# Patient Record
Sex: Female | Born: 1955 | Race: Black or African American | Hispanic: No | Marital: Married | Smoking: Never smoker
Health system: Southern US, Community
[De-identification: ages and names within clinical notes are randomized; demographics above are authoritative.]

## PROBLEM LIST (undated history)

## (undated) DIAGNOSIS — E785 Hyperlipidemia, unspecified: Secondary | ICD-10-CM

## (undated) DIAGNOSIS — D3A00 Benign carcinoid tumor of unspecified site: Secondary | ICD-10-CM

## (undated) DIAGNOSIS — I10 Essential (primary) hypertension: Secondary | ICD-10-CM

## (undated) DIAGNOSIS — D649 Anemia, unspecified: Secondary | ICD-10-CM

## (undated) DIAGNOSIS — E119 Type 2 diabetes mellitus without complications: Secondary | ICD-10-CM

## (undated) HISTORY — PX: NASAL SEPTUM SURGERY: SHX37

## (undated) HISTORY — PX: OTHER SURGICAL HISTORY: SHX169

## (undated) HISTORY — DX: Essential (primary) hypertension: I10

## (undated) HISTORY — DX: Benign carcinoid tumor of unspecified site: D3A.00

## (undated) HISTORY — DX: Hyperlipidemia, unspecified: E78.5

## (undated) HISTORY — DX: Anemia, unspecified: D64.9

---

## 1997-06-07 ENCOUNTER — Ambulatory Visit (HOSPITAL_COMMUNITY): Admission: RE | Admit: 1997-06-07 | Discharge: 1997-06-07 | Payer: Self-pay | Admitting: *Deleted

## 1997-06-21 ENCOUNTER — Ambulatory Visit (HOSPITAL_COMMUNITY): Admission: RE | Admit: 1997-06-21 | Discharge: 1997-06-21 | Payer: Self-pay | Admitting: *Deleted

## 1998-04-04 ENCOUNTER — Other Ambulatory Visit: Admission: RE | Admit: 1998-04-04 | Discharge: 1998-04-04 | Payer: Self-pay | Admitting: *Deleted

## 1998-05-20 ENCOUNTER — Other Ambulatory Visit: Admission: RE | Admit: 1998-05-20 | Discharge: 1998-05-20 | Payer: Self-pay | Admitting: *Deleted

## 1998-06-26 ENCOUNTER — Encounter: Payer: Self-pay | Admitting: *Deleted

## 1998-06-26 ENCOUNTER — Ambulatory Visit (HOSPITAL_COMMUNITY): Admission: RE | Admit: 1998-06-26 | Discharge: 1998-06-26 | Payer: Self-pay | Admitting: *Deleted

## 1998-07-11 ENCOUNTER — Encounter: Payer: Self-pay | Admitting: *Deleted

## 1998-07-11 ENCOUNTER — Ambulatory Visit (HOSPITAL_COMMUNITY): Admission: RE | Admit: 1998-07-11 | Discharge: 1998-07-11 | Payer: Self-pay | Admitting: *Deleted

## 1998-12-24 ENCOUNTER — Other Ambulatory Visit: Admission: RE | Admit: 1998-12-24 | Discharge: 1998-12-24 | Payer: Self-pay | Admitting: *Deleted

## 1999-01-06 ENCOUNTER — Ambulatory Visit (HOSPITAL_COMMUNITY): Admission: RE | Admit: 1999-01-06 | Discharge: 1999-01-06 | Payer: Self-pay | Admitting: *Deleted

## 1999-01-06 ENCOUNTER — Encounter: Payer: Self-pay | Admitting: *Deleted

## 1999-07-23 ENCOUNTER — Encounter: Payer: Self-pay | Admitting: *Deleted

## 1999-07-23 ENCOUNTER — Encounter: Admission: RE | Admit: 1999-07-23 | Discharge: 1999-07-23 | Payer: Self-pay | Admitting: *Deleted

## 2000-03-23 ENCOUNTER — Other Ambulatory Visit: Admission: RE | Admit: 2000-03-23 | Discharge: 2000-03-23 | Payer: Self-pay | Admitting: *Deleted

## 2000-11-03 ENCOUNTER — Ambulatory Visit (HOSPITAL_COMMUNITY): Admission: RE | Admit: 2000-11-03 | Discharge: 2000-11-03 | Payer: Self-pay | Admitting: *Deleted

## 2000-11-03 ENCOUNTER — Encounter: Payer: Self-pay | Admitting: *Deleted

## 2000-11-08 ENCOUNTER — Encounter: Admission: RE | Admit: 2000-11-08 | Discharge: 2000-11-08 | Payer: Self-pay | Admitting: *Deleted

## 2000-11-08 ENCOUNTER — Encounter: Payer: Self-pay | Admitting: *Deleted

## 2001-04-14 ENCOUNTER — Encounter: Payer: Self-pay | Admitting: *Deleted

## 2001-04-14 ENCOUNTER — Encounter: Admission: RE | Admit: 2001-04-14 | Discharge: 2001-04-14 | Payer: Self-pay | Admitting: *Deleted

## 2001-04-18 ENCOUNTER — Other Ambulatory Visit: Admission: RE | Admit: 2001-04-18 | Discharge: 2001-04-18 | Payer: Self-pay | Admitting: *Deleted

## 2002-07-20 ENCOUNTER — Encounter: Payer: Self-pay | Admitting: *Deleted

## 2002-07-20 ENCOUNTER — Encounter: Admission: RE | Admit: 2002-07-20 | Discharge: 2002-07-20 | Payer: Self-pay | Admitting: *Deleted

## 2002-07-31 ENCOUNTER — Encounter: Payer: Self-pay | Admitting: *Deleted

## 2002-07-31 ENCOUNTER — Encounter: Admission: RE | Admit: 2002-07-31 | Discharge: 2002-07-31 | Payer: Self-pay | Admitting: *Deleted

## 2002-08-08 ENCOUNTER — Other Ambulatory Visit: Admission: RE | Admit: 2002-08-08 | Discharge: 2002-08-08 | Payer: Self-pay | Admitting: *Deleted

## 2003-11-30 ENCOUNTER — Encounter: Admission: RE | Admit: 2003-11-30 | Discharge: 2003-11-30 | Payer: Self-pay | Admitting: *Deleted

## 2004-04-23 ENCOUNTER — Other Ambulatory Visit: Admission: RE | Admit: 2004-04-23 | Discharge: 2004-04-23 | Payer: Self-pay | Admitting: *Deleted

## 2004-05-30 ENCOUNTER — Emergency Department (HOSPITAL_COMMUNITY): Admission: EM | Admit: 2004-05-30 | Discharge: 2004-05-30 | Payer: Self-pay | Admitting: Emergency Medicine

## 2004-12-17 ENCOUNTER — Encounter: Admission: RE | Admit: 2004-12-17 | Discharge: 2004-12-17 | Payer: Self-pay | Admitting: *Deleted

## 2005-06-09 ENCOUNTER — Other Ambulatory Visit: Admission: RE | Admit: 2005-06-09 | Discharge: 2005-06-09 | Payer: Self-pay | Admitting: *Deleted

## 2006-01-12 ENCOUNTER — Encounter: Admission: RE | Admit: 2006-01-12 | Discharge: 2006-01-12 | Payer: Self-pay | Admitting: *Deleted

## 2006-08-24 ENCOUNTER — Other Ambulatory Visit: Admission: RE | Admit: 2006-08-24 | Discharge: 2006-08-24 | Payer: Self-pay | Admitting: *Deleted

## 2007-01-17 ENCOUNTER — Encounter: Admission: RE | Admit: 2007-01-17 | Discharge: 2007-01-17 | Payer: Self-pay | Admitting: *Deleted

## 2008-01-18 ENCOUNTER — Encounter: Admission: RE | Admit: 2008-01-18 | Discharge: 2008-01-18 | Payer: Self-pay | Admitting: Gynecology

## 2008-08-22 ENCOUNTER — Emergency Department (HOSPITAL_COMMUNITY): Admission: EM | Admit: 2008-08-22 | Discharge: 2008-08-22 | Payer: Self-pay | Admitting: Emergency Medicine

## 2008-11-21 ENCOUNTER — Encounter: Admission: RE | Admit: 2008-11-21 | Discharge: 2008-11-21 | Payer: Self-pay | Admitting: Family Medicine

## 2009-01-18 ENCOUNTER — Encounter: Admission: RE | Admit: 2009-01-18 | Discharge: 2009-01-18 | Payer: Self-pay | Admitting: Family Medicine

## 2010-01-20 ENCOUNTER — Encounter
Admission: RE | Admit: 2010-01-20 | Discharge: 2010-01-20 | Payer: Self-pay | Source: Home / Self Care | Attending: Family Medicine | Admitting: Family Medicine

## 2010-03-02 ENCOUNTER — Encounter: Payer: Self-pay | Admitting: *Deleted

## 2010-05-19 LAB — POCT I-STAT, CHEM 8
BUN: 18 mg/dL (ref 6–23)
Calcium, Ion: 1.21 mmol/L (ref 1.12–1.32)
Chloride: 106 mEq/L (ref 96–112)
Creatinine, Ser: 1 mg/dL (ref 0.4–1.2)
Glucose, Bld: 74 mg/dL (ref 70–99)
HCT: 42 % (ref 36.0–46.0)
Hemoglobin: 14.3 g/dL (ref 12.0–15.0)
Potassium: 3.6 mEq/L (ref 3.5–5.1)
Sodium: 140 mEq/L (ref 135–145)
TCO2: 28 mmol/L (ref 0–100)

## 2010-12-15 ENCOUNTER — Other Ambulatory Visit: Payer: Self-pay | Admitting: Gynecology

## 2010-12-15 DIAGNOSIS — Z1231 Encounter for screening mammogram for malignant neoplasm of breast: Secondary | ICD-10-CM

## 2011-01-26 ENCOUNTER — Ambulatory Visit
Admission: RE | Admit: 2011-01-26 | Discharge: 2011-01-26 | Disposition: A | Discharge status: Home / Self Care | Payer: BC Managed Care – PPO | Source: Ambulatory Visit | Attending: Gynecology | Admitting: Gynecology

## 2011-01-26 DIAGNOSIS — Z1231 Encounter for screening mammogram for malignant neoplasm of breast: Secondary | ICD-10-CM

## 2011-12-21 ENCOUNTER — Other Ambulatory Visit: Payer: Self-pay | Admitting: Gynecology

## 2011-12-21 DIAGNOSIS — Z1231 Encounter for screening mammogram for malignant neoplasm of breast: Secondary | ICD-10-CM

## 2012-01-28 ENCOUNTER — Ambulatory Visit
Admission: RE | Admit: 2012-01-28 | Discharge: 2012-01-28 | Disposition: A | Discharge status: Home / Self Care | Payer: BC Managed Care – PPO | Source: Ambulatory Visit | Attending: Gynecology | Admitting: Gynecology

## 2012-01-28 DIAGNOSIS — Z1231 Encounter for screening mammogram for malignant neoplasm of breast: Secondary | ICD-10-CM

## 2012-12-26 ENCOUNTER — Other Ambulatory Visit: Payer: Self-pay

## 2012-12-26 DIAGNOSIS — Z1231 Encounter for screening mammogram for malignant neoplasm of breast: Secondary | ICD-10-CM

## 2013-02-20 ENCOUNTER — Ambulatory Visit: Payer: BC Managed Care – PPO

## 2013-03-07 ENCOUNTER — Ambulatory Visit
Admission: RE | Admit: 2013-03-07 | Discharge: 2013-03-07 | Disposition: A | Discharge status: Home / Self Care | Payer: BC Managed Care – PPO | Source: Ambulatory Visit

## 2013-03-07 DIAGNOSIS — Z1231 Encounter for screening mammogram for malignant neoplasm of breast: Secondary | ICD-10-CM

## 2014-03-05 ENCOUNTER — Other Ambulatory Visit: Payer: Self-pay

## 2014-03-05 DIAGNOSIS — Z1231 Encounter for screening mammogram for malignant neoplasm of breast: Secondary | ICD-10-CM

## 2014-03-13 ENCOUNTER — Ambulatory Visit
Admission: RE | Admit: 2014-03-13 | Discharge: 2014-03-13 | Disposition: A | Discharge status: Home / Self Care | Payer: BC Managed Care – PPO | Source: Ambulatory Visit

## 2014-03-13 DIAGNOSIS — Z1231 Encounter for screening mammogram for malignant neoplasm of breast: Secondary | ICD-10-CM

## 2015-03-04 ENCOUNTER — Other Ambulatory Visit: Payer: Self-pay

## 2015-03-04 DIAGNOSIS — Z1231 Encounter for screening mammogram for malignant neoplasm of breast: Secondary | ICD-10-CM

## 2015-03-18 ENCOUNTER — Ambulatory Visit
Admission: RE | Admit: 2015-03-18 | Discharge: 2015-03-18 | Disposition: A | Discharge status: Home / Self Care | Payer: BC Managed Care – PPO | Source: Ambulatory Visit

## 2015-03-18 DIAGNOSIS — Z1231 Encounter for screening mammogram for malignant neoplasm of breast: Secondary | ICD-10-CM

## 2016-03-03 ENCOUNTER — Other Ambulatory Visit: Payer: Self-pay | Admitting: Family Medicine

## 2016-03-03 DIAGNOSIS — Z1231 Encounter for screening mammogram for malignant neoplasm of breast: Secondary | ICD-10-CM

## 2016-04-07 ENCOUNTER — Ambulatory Visit
Admission: RE | Admit: 2016-04-07 | Discharge: 2016-04-07 | Disposition: A | Discharge status: Home / Self Care | Payer: BC Managed Care – PPO | Source: Ambulatory Visit | Attending: Family Medicine | Admitting: Family Medicine

## 2016-04-07 DIAGNOSIS — Z1231 Encounter for screening mammogram for malignant neoplasm of breast: Secondary | ICD-10-CM

## 2017-03-02 ENCOUNTER — Other Ambulatory Visit: Payer: Self-pay | Admitting: Family Medicine

## 2017-03-02 DIAGNOSIS — Z1231 Encounter for screening mammogram for malignant neoplasm of breast: Secondary | ICD-10-CM

## 2017-04-19 ENCOUNTER — Ambulatory Visit
Admission: RE | Admit: 2017-04-19 | Discharge: 2017-04-19 | Disposition: A | Discharge status: Home / Self Care | Payer: BC Managed Care – PPO | Source: Ambulatory Visit | Attending: Family Medicine | Admitting: Family Medicine

## 2017-04-19 ENCOUNTER — Encounter: Payer: Self-pay | Admitting: Radiology

## 2017-04-19 DIAGNOSIS — Z1231 Encounter for screening mammogram for malignant neoplasm of breast: Secondary | ICD-10-CM

## 2018-03-12 ENCOUNTER — Encounter (HOSPITAL_COMMUNITY): Payer: Self-pay | Admitting: Emergency Medicine

## 2018-03-12 ENCOUNTER — Other Ambulatory Visit: Payer: Self-pay

## 2018-03-12 ENCOUNTER — Emergency Department (HOSPITAL_COMMUNITY)
Admission: EM | Admit: 2018-03-12 | Discharge: 2018-03-12 | Disposition: A | Discharge status: Other Acute Inpatient Hospital | Payer: BC Managed Care – PPO | Source: Home / Self Care | Attending: Emergency Medicine | Admitting: Emergency Medicine

## 2018-03-12 ENCOUNTER — Encounter (HOSPITAL_COMMUNITY): Payer: Self-pay

## 2018-03-12 ENCOUNTER — Inpatient Hospital Stay (HOSPITAL_COMMUNITY)
Admission: AD | Admit: 2018-03-12 | Discharge: 2018-03-16 | DRG: 885 | Disposition: A | Discharge status: Home / Self Care | Payer: BC Managed Care – PPO | Source: Intra-hospital | Attending: Psychiatry | Admitting: Psychiatry

## 2018-03-12 ENCOUNTER — Inpatient Hospital Stay (HOSPITAL_COMMUNITY): Payer: BC Managed Care – PPO

## 2018-03-12 DIAGNOSIS — E785 Hyperlipidemia, unspecified: Secondary | ICD-10-CM | POA: Diagnosis present

## 2018-03-12 DIAGNOSIS — E7800 Pure hypercholesterolemia, unspecified: Secondary | ICD-10-CM | POA: Diagnosis present

## 2018-03-12 DIAGNOSIS — F22 Delusional disorders: Secondary | ICD-10-CM

## 2018-03-12 DIAGNOSIS — F1420 Cocaine dependence, uncomplicated: Secondary | ICD-10-CM | POA: Diagnosis not present

## 2018-03-12 DIAGNOSIS — Z79899 Other long term (current) drug therapy: Secondary | ICD-10-CM

## 2018-03-12 DIAGNOSIS — F14259 Cocaine dependence with cocaine-induced psychotic disorder, unspecified: Secondary | ICD-10-CM | POA: Diagnosis present

## 2018-03-12 DIAGNOSIS — F14251 Cocaine dependence with cocaine-induced psychotic disorder with hallucinations: Secondary | ICD-10-CM | POA: Insufficient documentation

## 2018-03-12 DIAGNOSIS — Z803 Family history of malignant neoplasm of breast: Secondary | ICD-10-CM

## 2018-03-12 DIAGNOSIS — Z820 Family history of epilepsy and other diseases of the nervous system: Secondary | ICD-10-CM | POA: Diagnosis not present

## 2018-03-12 DIAGNOSIS — I10 Essential (primary) hypertension: Secondary | ICD-10-CM | POA: Diagnosis present

## 2018-03-12 DIAGNOSIS — F419 Anxiety disorder, unspecified: Secondary | ICD-10-CM | POA: Diagnosis present

## 2018-03-12 DIAGNOSIS — G35 Multiple sclerosis: Secondary | ICD-10-CM | POA: Diagnosis present

## 2018-03-12 DIAGNOSIS — Z8249 Family history of ischemic heart disease and other diseases of the circulatory system: Secondary | ICD-10-CM | POA: Diagnosis not present

## 2018-03-12 DIAGNOSIS — Z85048 Personal history of other malignant neoplasm of rectum, rectosigmoid junction, and anus: Secondary | ICD-10-CM

## 2018-03-12 DIAGNOSIS — R441 Visual hallucinations: Secondary | ICD-10-CM

## 2018-03-12 DIAGNOSIS — F323 Major depressive disorder, single episode, severe with psychotic features: Principal | ICD-10-CM | POA: Diagnosis present

## 2018-03-12 DIAGNOSIS — R443 Hallucinations, unspecified: Secondary | ICD-10-CM

## 2018-03-12 DIAGNOSIS — D509 Iron deficiency anemia, unspecified: Secondary | ICD-10-CM | POA: Diagnosis present

## 2018-03-12 LAB — COMPREHENSIVE METABOLIC PANEL
ALT: 47 U/L — ABNORMAL HIGH (ref 0–44)
AST: 46 U/L — ABNORMAL HIGH (ref 15–41)
Albumin: 4.1 g/dL (ref 3.5–5.0)
Alkaline Phosphatase: 50 U/L (ref 38–126)
Anion gap: 13 (ref 5–15)
BUN: 17 mg/dL (ref 8–23)
CO2: 28 mmol/L (ref 22–32)
Calcium: 9.9 mg/dL (ref 8.9–10.3)
Chloride: 100 mmol/L (ref 98–111)
Creatinine, Ser: 1.1 mg/dL — ABNORMAL HIGH (ref 0.44–1.00)
GFR calc Af Amer: >60 mL/min (ref >60)
GFR calc non Af Amer: 53 mL/min — ABNORMAL LOW (ref >60)
Glucose, Bld: 108 mg/dL — ABNORMAL HIGH (ref 70–99)
Potassium: 3.1 mmol/L — ABNORMAL LOW (ref 3.5–5.1)
Sodium: 141 mmol/L (ref 135–145)
TOTAL PROTEIN: 8.3 g/dL — AB (ref 6.5–8.1)
Total Bilirubin: 0.7 mg/dL (ref 0.3–1.2)

## 2018-03-12 LAB — CBC WITH DIFFERENTIAL/PLATELET
Abs Immature Granulocytes: 0.01 10*3/uL (ref 0.00–0.07)
Basophils Absolute: 0.1 10*3/uL (ref 0.0–0.1)
Basophils Relative: 1 %
EOS ABS: 0.1 10*3/uL (ref 0.0–0.5)
Eosinophils Relative: 1 %
HCT: 41.2 % (ref 36.0–46.0)
Hemoglobin: 12.2 g/dL (ref 12.0–15.0)
Immature Granulocytes: 0 %
Lymphocytes Relative: 29 %
Lymphs Abs: 2.1 10*3/uL (ref 0.7–4.0)
MCH: 22.8 pg — ABNORMAL LOW (ref 26.0–34.0)
MCHC: 29.6 g/dL — ABNORMAL LOW (ref 30.0–36.0)
MCV: 77 fL — ABNORMAL LOW (ref 80.0–100.0)
Monocytes Absolute: 0.8 10*3/uL (ref 0.1–1.0)
Monocytes Relative: 11 %
Neutro Abs: 4.2 10*3/uL (ref 1.7–7.7)
Neutrophils Relative %: 58 %
Platelets: 389 10*3/uL (ref 150–400)
RBC: 5.35 MIL/uL — ABNORMAL HIGH (ref 3.87–5.11)
RDW: 13.5 % (ref 11.5–15.5)
WBC: 7.3 10*3/uL (ref 4.0–10.5)
nRBC: 0 % (ref 0.0–0.2)

## 2018-03-12 LAB — RAPID URINE DRUG SCREEN, HOSP PERFORMED
Amphetamines: NOT DETECTED
Barbiturates: NOT DETECTED
Benzodiazepines: POSITIVE — AB
Cocaine: POSITIVE — AB
Opiates: NOT DETECTED
Tetrahydrocannabinol: NOT DETECTED

## 2018-03-12 LAB — ACETAMINOPHEN LEVEL: Acetaminophen (Tylenol), Serum: <10 ug/mL — ABNORMAL LOW (ref 10–30)

## 2018-03-12 LAB — SALICYLATE LEVEL: Salicylate Lvl: <7 mg/dL (ref 2.8–30.0)

## 2018-03-12 LAB — ETHANOL: Alcohol, Ethyl (B): <10 mg/dL (ref <10)

## 2018-03-12 MED ORDER — ATENOLOL 50 MG PO TABS
50.0000 mg | ORAL_TABLET | Freq: Every day | ORAL | Status: DC
  Administered 2018-03-12 – 2018-03-16 (×3): 50 mg via ORAL
  Filled 2018-03-12 (×8): qty 1

## 2018-03-12 MED ORDER — QUETIAPINE FUMARATE 100 MG PO TABS
100.0000 mg | ORAL_TABLET | Freq: Every day | ORAL | Status: DC
  Administered 2018-03-12: 100 mg via ORAL
  Filled 2018-03-12 (×4): qty 1

## 2018-03-12 MED ORDER — IOPAMIDOL (ISOVUE-370) INJECTION 76%
75.0000 mL | Freq: Once | INTRAVENOUS | Status: AC | PRN
  Administered 2018-03-12: 75 mL via INTRAVENOUS
  Filled 2018-03-12: qty 75

## 2018-03-12 MED ORDER — HYDROCHLOROTHIAZIDE 25 MG PO TABS
25.0000 mg | ORAL_TABLET | Freq: Every day | ORAL | Status: DC
  Administered 2018-03-12 – 2018-03-16 (×3): 25 mg via ORAL
  Filled 2018-03-12 (×8): qty 1

## 2018-03-12 MED ORDER — POTASSIUM CHLORIDE CRYS ER 20 MEQ PO TBCR: 40.0000 meq | EXTENDED_RELEASE_TABLET | Freq: Two times a day (BID) | ORAL | Status: DC

## 2018-03-12 MED ORDER — SIMVASTATIN 40 MG PO TABS
40.0000 mg | ORAL_TABLET | Freq: Every day | ORAL | Status: DC
  Administered 2018-03-12 – 2018-03-16 (×5): 40 mg via ORAL
  Filled 2018-03-12 (×5): qty 1
  Filled 2018-03-12: qty 2
  Filled 2018-03-12 (×2): qty 1

## 2018-03-12 MED ORDER — ACETAMINOPHEN 325 MG PO TABS: 650.0000 mg | ORAL_TABLET | Freq: Four times a day (QID) | ORAL | Status: DC | PRN

## 2018-03-12 MED ORDER — HYDROXYZINE HCL 25 MG PO TABS: 25.0000 mg | ORAL_TABLET | Freq: Three times a day (TID) | ORAL | Status: DC | PRN

## 2018-03-12 MED ORDER — ALUM & MAG HYDROXIDE-SIMETH 200-200-20 MG/5ML PO SUSP: 30.0000 mL | ORAL | Status: DC | PRN

## 2018-03-12 MED ORDER — INFLUENZA VAC SPLIT QUAD 0.5 ML IM SUSY
0.5000 mL | PREFILLED_SYRINGE | INTRAMUSCULAR | Status: DC
  Filled 2018-03-12: qty 0.5

## 2018-03-12 MED ORDER — FERROUS SULFATE 325 (65 FE) MG PO TABS
325.0000 mg | ORAL_TABLET | Freq: Every day | ORAL | Status: DC
  Administered 2018-03-12 – 2018-03-16 (×5): 325 mg via ORAL
  Filled 2018-03-12 (×8): qty 1

## 2018-03-12 MED ORDER — ADULT MULTIVITAMIN W/MINERALS CH
1.0000 | ORAL_TABLET | Freq: Every day | ORAL | Status: DC
  Administered 2018-03-12 – 2018-03-16 (×5): 1 via ORAL
  Filled 2018-03-12 (×8): qty 1

## 2018-03-12 MED ORDER — VITAMIN D 25 MCG (1000 UNIT) PO TABS
1000.0000 [IU] | ORAL_TABLET | Freq: Every day | ORAL | Status: DC
  Administered 2018-03-12 – 2018-03-16 (×5): 1000 [IU] via ORAL
  Filled 2018-03-12 (×8): qty 1

## 2018-03-12 MED ORDER — MAGNESIUM HYDROXIDE 400 MG/5ML PO SUSP: 30.0000 mL | Freq: Every day | ORAL | Status: DC | PRN

## 2018-03-12 NOTE — BH Assessment (Addendum)
Tele Assessment Note   Patient Name: Jody Simmons MRN: [id number] Referring Physician: Charlann Lange, PA-C Location of Patient: [facility] ED, [phone] Location of Provider: Behavioral Health TTS Department  [name] is an 63 y.o. married female who presents to [facility] ED accompanied by her two adult children, who did not participate in the assessment at Pt's request. Pt says her children are concerned because Pt's parents both had dementia and they think Pt may have mental health problems. Pt says she has had conflicts recently with her husband because he found out she was using cocaine. Pt says she believes he is putting substances in her drink. Pt says she has not seen him do this but thinks her drinks taste different. Pt says her husband is putting things in the floor that other people don't see. Pt's family reported to ED staff that Pt is hearing a beeping noise in the house and that she is seeing people hiding under the bed. Pt acknowledges that she has been feeling anxious. She says she takes Advil PM to sleep. She acknowledges her appetite has decreased. She denies current suicidal ideation or history of suicide attempts. She denies homicidal ideation or history of aggression.  Pt reports she is snorting or ingesting cocaine and her family is upset because they just found out she has been using. Pt reports she has used "on and off for the past 2-3 years this time." Pt reports she has not use cocaine in a month however Pt's urine drug screen is positive for cocaine and benzodiazepines (Pt is prescribed Klonopin). Pt reports drinking one mixed drink daily. She denies other substance use or that she doesn't take medications.  Pt identifies conflicts with her husband as her only stressor. She says she lives with her husband and he is upset with her. She says her two children are supportive. Pt says she retired in July 2019. She denies any history of abuse or trauma. She denies legal problems. Pt  denies any mental health treatment other than grief counseling in 2012 following the deaths of her parents.  Pt is dressed in hospital scrubs, alert and oriented x4. Pt speaks in a clear tone, at moderate volume and normal pace. Motor behavior appears normal. Eye contact is fair. Pt's mood is anxious and affect is congruent with mood. Thought process is coherent and relevant. Pt was cooperative throughout assessment. She says she is willing to sign voluntarily into a psychiatric facility if recommended.   Diagnosis:  F32.3 Major depressive disorder, Single episode, With psychotic features F14.20 Cocaine use disorder, Moderate  Past Medical History:  Past Medical History:  Diagnosis Date  . Anemia   . Carcinoid tumor    rectum  . Hyperlipidemia   . Hypertension     Past Surgical History:  Procedure Laterality Date  . fibroidectomy    . NASAL SEPTUM SURGERY      Family History:  Family History  Problem Relation Age of Onset  . Hypertension Mother   . Hypertension Father   . Breast cancer Maternal Grandmother     Social History:  reports that she has never smoked. She has never used smokeless tobacco. She reports current drug use. Drug: Cocaine. No history on file for alcohol.  Additional Social History:  Alcohol / Drug Use Pain Medications: Pt denies use Prescriptions: Pt denies abuse Over the Counter: Pt denies abuse History of alcohol / drug use?: Yes Longest period of sobriety (when/how long): 2 years Negative Consequences of  Use: Personal relationships Substance #1 Name of Substance 1: Cocaine (powder) 1 - Age of First Use: Pt unsure 1 - Amount (size/oz): Varies 1 - Frequency: Pt is vague and unspecific 1 - Duration: 2-3 years this episode 1 - Last Use / Amount: Unknown - Pt says one month ago but UDS is positive  CIWA: CIWA-Ar BP: (!) 150/87 Pulse Rate: 80 COWS:    Allergies: No Known Allergies  Home Medications: (Not in a hospital admission)   OB/GYN  Status:  No LMP recorded. Patient is postmenopausal.  General Assessment Data Location of Assessment: [facility] ED TTS Assessment: In system Is this a Tele or Face-to-Face Assessment?: Tele Assessment Is this an Initial Assessment or a Re-assessment for this encounter?: Initial Assessment Patient Accompanied by:: Other(Two adult children) Language Other than English: No Living Arrangements: Other (Comment)(Lives with husband) What gender do you identify as?: Female Marital status: Married Pregnancy Status: No Living Arrangements: Spouse/significant other Can pt return to current living arrangement?: Yes Admission Status: Voluntary Is patient capable of signing voluntary admission?: Yes Referral Source: Self/Family/Friend Insurance type: [place] Living Arrangements: Spouse/significant other Legal Guardian: Other:(Self) Name of Psychiatrist: None Name of Therapist: None  Education Status Is patient currently in school?: No Is the patient employed, unemployed or receiving disability?: Unemployed  Risk to self with the past 6 months Suicidal Ideation: No Has patient been a risk to self within the past 6 months prior to admission? : No Suicidal Intent: No Has patient had any suicidal intent within the past 6 months prior to admission? : No Is patient at risk for suicide?: No Suicidal Plan?: No Has patient had any suicidal plan within the past 6 months prior to admission? : No Access to Means: No What has been your use of drugs/alcohol within the last 12 months?: Pt using cocaine Previous Attempts/Gestures: No How many times?: 0 Other Self Harm Risks: None Triggers for Past Attempts: None known Intentional Self Injurious Behavior: None Family Suicide History: No Recent stressful life event(s): Conflict (Comment)(Conflict with husband) Persecutory voices/beliefs?: Yes Depression: Yes Depression Symptoms: Feeling angry/irritable, Despondent Substance abuse  history and/or treatment for substance abuse?: Yes Suicide prevention information given to non-admitted patients: Not applicable  Risk to Others within the past 6 months Homicidal Ideation: No Does patient have any lifetime risk of violence toward others beyond the six months prior to admission? : No Thoughts of Harm to Others: No Current Homicidal Intent: No Current Homicidal Plan: No Access to Homicidal Means: No Identified Victim: None History of harm to others?: No Assessment of Violence: None Noted Violent Behavior Description: Pt denies history of violence Does patient have access to weapons?: No Criminal Charges Pending?: No Does patient have a court date: No Is patient on probation?: No  Psychosis Hallucinations: Visual, Auditory Delusions: Persecutory  Mental Status Report Appearance/Hygiene: In scrubs Eye Contact: Fair Motor Activity: Unremarkable Speech: Logical/coherent Level of Consciousness: Alert Mood: Anxious Affect: Anxious Anxiety Level: Moderate Thought Processes: Coherent, Relevant Judgement: Impaired Orientation: Person, Place, Time, Situation Obsessive Compulsive Thoughts/Behaviors: None  Cognitive Functioning Concentration: Fair Memory: Recent Intact, Remote Intact Is patient IDD: No Insight: Poor Impulse Control: Fair Appetite: Poor Have you had any weight changes? : No Change Sleep: No Change Total Hours of Sleep: 8 Vegetative Symptoms: None  ADLScreening [facility] Assessment Services) Patient's cognitive ability adequate to safely complete daily activities?: Yes Patient able to express need for assistance with ADLs?: Yes Independently performs ADLs?: Yes (appropriate for developmental  age)  Prior Inpatient Therapy Prior Inpatient Therapy: No  Prior Outpatient Therapy Prior Outpatient Therapy: Yes Prior Therapy Dates: 2012 Prior Therapy Facilty/Provider(s): Unknown Reason for Treatment: Grief counseling Does patient have an ACCT team?:  No Does patient have Intensive In-House Services?  : No Does patient have [facility] services? : No Does patient have P4CC services?: No  ADL Screening (condition at time of admission) Patient's cognitive ability adequate to safely complete daily activities?: Yes Is the patient deaf or have difficulty hearing?: No Does the patient have difficulty seeing, even when wearing glasses/contacts?: No Does the patient have difficulty concentrating, remembering, or making decisions?: No Patient able to express need for assistance with ADLs?: Yes Does the patient have difficulty dressing or bathing?: No Independently performs ADLs?: Yes (appropriate for developmental age) Does the patient have difficulty walking or climbing stairs?: No Weakness of Legs: None Weakness of Arms/Hands: None  Home Assistive Devices/Equipment Home Assistive Devices/Equipment: None    Abuse/Neglect Assessment (Assessment to be complete while patient is alone) Abuse/Neglect Assessment Can Be Completed: Yes Physical Abuse: Denies Verbal Abuse: Denies Sexual Abuse: Denies Exploitation of patient/patient's resources: Denies Self-Neglect: Denies     Regulatory affairs officer (For Healthcare) Does Patient Have a Medical Advance Directive?: Yes Does patient want to make changes to medical advance directive?: No - Patient declined Type of Advance Directive: [place] in Chart?: No - copy requested          Disposition: Research officer, trade union, [place] at [facility], confirmed bed availability. Gave clinical report to Lindon Romp, FNP who said Pt meets criteria for inpatient psychiatric treatment and accepted Pt to the service of Dr. Johnn Hai, room 500-1, after Pt is medically cleared. Notified Charlann Lange, PA-C and Ezequiel Essex, RN of acceptance.  Disposition Initial Assessment Completed for this Encounter: Yes  This service was provided via telemedicine using a 2-way,  interactive audio and video technology.  Names of all persons participating in this telemedicine service and their role in this encounter. Name: Viviann Spare Role: Patient  Name: Storm Frisk, [facility] Role: TTS counselor         Orpah Greek Anson Fret, [facility], [facility], [facility] Triage Specialist [phone]  Evelena Peat 03/12/2018 3:03 AM

## 2018-03-12 NOTE — Progress Notes (Signed)
Called Radiology 3 x since 1920 with no answer

## 2018-03-12 NOTE — ED Triage Notes (Signed)
Pt arrived via [place] with daughter and son. Family reports pt has been hearing beeping in the house, seeing her husband do things that he is not. Pt's daughter reports pt has been paranoid that her husband is trying to poison her, or putting up more security in the house. Pt denies SI/HI. She reports she used cocaine about 3 weeks ago.

## 2018-03-12 NOTE — H&P (Signed)
Psychiatric Admission Assessment Adult  Patient Identification: Jody Simmons MRN:  [id number] Date of Evaluation:  03/12/2018 Chief Complaint:  MDD with psychotic features Cocaine Use Disorder Principal Diagnosis: <principal problem not specified> Diagnosis:  Active Problems:   Severe major depression with psychotic features, mood-congruent ([place])  History of Present Illness: Patient is seen and examined.  Patient is a 63 year old female with a past psychiatric history significant for cocaine dependence who presented to the [facility] emergency department on 03/12/2018 after her children brought her in for psychiatric assessment.  The patient stated that her children were concerned about her recent behavior.  The patient has a family history of dementia and her children were worried that she was developing dementia.  Patient stated that she has been using cocaine for several years.  She stated that she has been using cocaine on a semi-regular basis over the last 7 years.  She stated that she had quit using before, and that her husband found out she had used.  That it caused conflict at least according to the patient.  She stated that he had been very paranoid about her.  She stated that he had placed a tracking device on her car that she had found, and that she felt as though he was putting substances in her drinks and food because the way that they tasted.  She also admitted that there were motion detectors in the home that monitor her movement.  She has a history of hypertension, but denied any previous strokes or heart attacks.  She stated that she had not used cocaine in a month, however her drug screen was positive for cocaine and benzodiazepines (she was prescribed clonazepam).  She stated that she drank alcohol but they were only "the [place] cans".  He denied any other substances.  She is retired from working at [employer].  She denied any family history of thought disorders.  She  denied any previous psychiatric admissions or treatment.  She was admitted to the hospital for evaluation and stabilization.  Associated Signs/Symptoms: Depression Symptoms:  psychomotor agitation, fatigue, difficulty concentrating, anxiety, loss of energy/fatigue, (Hypo) Manic Symptoms:  Delusions, Hallucinations, Impulsivity, Irritable Mood, Anxiety Symptoms:  Excessive Worry, Psychotic Symptoms:  Delusions, Hallucinations: Auditory Visual Paranoia, PTSD Symptoms: Negative Total Time spent with patient: 45 minutes  Past Psychiatric History: Patient denied any previous psychiatric admissions.  She does receive clonazepam from her primary care provider.  She has abused cocaine for many years, but has not been seen by a psychiatrist, not been admitted to any facilities.  Is the patient at risk to self? No.  Has the patient been a risk to self in the past 6 months? No.  Has the patient been a risk to self within the distant past? No.  Is the patient a risk to others? Yes.    Has the patient been a risk to others in the past 6 months? Yes.    Has the patient been a risk to others within the distant past? No.   Prior Inpatient Therapy:   Prior Outpatient Therapy:    Alcohol Screening: Patient refused Alcohol Screening Tool: Yes 1. How often do you have a drink containing alcohol?: Monthly or less 2. How many drinks containing alcohol do you have on a typical day when you are drinking?: 1 or 2 3. How often do you have six or more drinks on one occasion?: Never AUDIT-C Score: 1 4. How often during the last year have you found  that you were not able to stop drinking once you had started?: Never 5. How often during the last year have you failed to do what was normally expected from you becasue of drinking?: Never 6. How often during the last year have you needed a first drink in the morning to get yourself going after a heavy drinking session?: Never 7. How often during the last year  have you had a feeling of guilt of remorse after drinking?: Never 8. How often during the last year have you been unable to remember what happened the night before because you had been drinking?: Never 9. Have you or someone else been injured as a result of your drinking?: No 10. Has a relative or friend or a doctor or another health worker been concerned about your drinking or suggested you cut down?: No Alcohol Use Disorder Identification Test Final Score (AUDIT): 1 Alcohol Brief Interventions/Follow-up: Alcohol Education Substance Abuse History in the last 12 months:  Yes.   Consequences of Substance Abuse: Medical Consequences:  Admission for this hospitalization secondary to paranoia. Family Consequences:  Marital difficulties with husband leaving her. Previous Psychotropic Medications: Yes  Psychological Evaluations: No  Past Medical History:  Past Medical History:  Diagnosis Date  . Anemia   . Carcinoid tumor    rectum  . Hyperlipidemia   . Hypertension     Past Surgical History:  Procedure Laterality Date  . fibroidectomy    . NASAL SEPTUM SURGERY     Family History:  Family History  Problem Relation Age of Onset  . Hypertension Mother   . Hypertension Father   . Breast cancer Maternal Grandmother    Family Psychiatric  History: Dementia in her parents Tobacco Screening: Have you used any form of tobacco in the last 30 days? (Cigarettes, Smokeless Tobacco, Cigars, and/or Pipes): No Social History:  Social History   Substance and Sexual Activity  Alcohol Use Not Currently     Social History   Substance and Sexual Activity  Drug Use Yes  . Types: Cocaine    Additional Social History:      History of alcohol / drug use?: Yes Negative Consequences of Use: Personal relationships Name of Substance 1: cocaine 1 - Amount (size/oz): $25 dollars worth 1 - Frequency: twice a week 1 - Duration: last 5 years 1 - Last Use / Amount: thursday 1/30                   Allergies:  No Known Allergies Lab Results:  Results for orders placed or performed during the hospital encounter of 03/12/18 (from the past 48 hour(s))  CBC with Differential     Status: Abnormal   Collection Time: 03/12/18  1:55 AM  Result Value Ref Range   WBC 7.3 4.0 - 10.5 K/uL   RBC 5.35 (H) 3.87 - 5.11 MIL/uL   Hemoglobin 12.2 12.0 - 15.0 g/dL   HCT 41.2 36.0 - 46.0 %   MCV 77.0 (L) 80.0 - 100.0 fL   MCH 22.8 (L) 26.0 - 34.0 pg   MCHC 29.6 (L) 30.0 - 36.0 g/dL   RDW 13.5 11.5 - 15.5 %   Platelets 389 150 - 400 K/uL   nRBC 0.0 0.0 - 0.2 %   Neutrophils Relative % 58 %   Neutro Abs 4.2 1.7 - 7.7 K/uL   Lymphocytes Relative 29 %   Lymphs Abs 2.1 0.7 - 4.0 K/uL   Monocytes Relative 11 %   Monocytes Absolute 0.8 0.1 -  1.0 K/uL   Eosinophils Relative 1 %   Eosinophils Absolute 0.1 0.0 - 0.5 K/uL   Basophils Relative 1 %   Basophils Absolute 0.1 0.0 - 0.1 K/uL   Immature Granulocytes 0 %   Abs Immature Granulocytes 0.01 0.00 - 0.07 K/uL    Comment: Performed at [facility] [address], [place], [place] [address]  Salicylate level     Status: None   Collection Time: 03/12/18  1:55 AM  Result Value Ref Range   Salicylate Lvl <0.0 2.8 - 30.0 mg/dL    Comment: Performed at [facility] [address], [place], [place] [address]  Comprehensive metabolic panel     Status: Abnormal   Collection Time: 03/12/18  1:55 AM  Result Value Ref Range   Sodium 141 135 - 145 mmol/L   Potassium 3.1 (L) 3.5 - 5.1 mmol/L   Chloride 100 98 - 111 mmol/L   CO2 28 22 - 32 mmol/L   Glucose, Bld 108 (H) 70 - 99 mg/dL   BUN 17 8 - 23 mg/dL   Creatinine, Ser 1.10 (H) 0.44 - 1.00 mg/dL   Calcium 9.9 8.9 - 10.3 mg/dL   Total Protein 8.3 (H) 6.5 - 8.1 g/dL   Albumin 4.1 3.5 - 5.0 g/dL   AST 46 (H) 15 - 41 U/L   ALT 47 (H) 0 - 44 U/L   Alkaline Phosphatase 50 38 - 126 U/L   Total Bilirubin 0.7 0.3 - 1.2 mg/dL   GFR calc non Af Amer 53 (L) >60 mL/min   GFR calc Af Amer >60  >60 mL/min   Anion gap 13 5 - 15    Comment: Performed at [facility], [place] [address], [place], [place] [address]  Acetaminophen level     Status: Abnormal   Collection Time: 03/12/18  1:55 AM  Result Value Ref Range   Acetaminophen (Tylenol), Serum <10 (L) 10 - 30 ug/mL    Comment: (NOTE) Therapeutic concentrations vary significantly. A range of 10-30 ug/mL  may be an effective concentration for many patients. However, some  are best treated at concentrations outside of this range. Acetaminophen concentrations >150 ug/mL at 4 hours after ingestion  and >50 ug/mL at 12 hours after ingestion are often associated with  toxic reactions. Performed at [facility], [place] [address], [place], [place] [address]   Ethanol     Status: None   Collection Time: 03/12/18  1:55 AM  Result Value Ref Range   Alcohol, Ethyl (B) <10 <10 mg/dL    Comment: (NOTE) Lowest detectable limit for serum alcohol is 10 mg/dL. For medical purposes only. Performed at [facility], [place] [address], [place],  [address]   Urine rapid drug screen (hosp performed)     Status: Abnormal   Collection Time: 03/12/18  1:56 AM  Result Value Ref Range   Opiates NONE DETECTED NONE DETECTED   Cocaine POSITIVE (A) NONE DETECTED   Benzodiazepines POSITIVE (A) NONE DETECTED   Amphetamines NONE DETECTED NONE DETECTED   Tetrahydrocannabinol NONE DETECTED NONE DETECTED   Barbiturates NONE DETECTED NONE DETECTED    Comment: (NOTE) DRUG SCREEN FOR MEDICAL PURPOSES ONLY.  IF CONFIRMATION IS NEEDED FOR ANY PURPOSE, NOTIFY LAB WITHIN 5 DAYS. LOWEST DETECTABLE LIMITS FOR URINE DRUG SCREEN Drug Class                     Cutoff (ng/mL) Amphetamine and metabolites  1000 Barbiturate and metabolites    200 Benzodiazepine                 161 Tricyclics and metabolites     300 Opiates and metabolites        300 Cocaine and metabolites        300 THC                            50 Performed at [facility], [place] [address] [place], [place] [address]     Blood Alcohol level:  Lab Results  Component Value Date   ETH <10 54/10/8117    Metabolic Disorder Labs:  No results found for: HGBA1C, MPG No results found for: PROLACTIN No results found for: CHOL, TRIG, HDL, CHOLHDL, VLDL, LDLCALC  Current Medications: Current Facility-Administered Medications  Medication Dose Route Frequency Provider Last Rate Last Dose  . acetaminophen (TYLENOL) tablet 650 mg  650 mg Oral Q6H PRN Lindon Romp A, NP      . alum & mag hydroxide-simeth (MAALOX/MYLANTA) 200-200-20 MG/5ML suspension 30 mL  30 mL Oral Q4H PRN Lindon Romp A, NP      . atenolol (TENORMIN) tablet 50 mg  50 mg Oral Daily Lindon Romp A, NP   50 mg at 03/12/18 0816  . cholecalciferol (VITAMIN D3) tablet 1,000 Units  1,000 Units Oral Daily Lindon Romp A, NP   1,000 Units at 03/12/18 0817  . ferrous sulfate tablet 325 mg  325 mg Oral Q breakfast Lindon Romp A, NP   325 mg at 03/12/18 1478  . hydrochlorothiazide (HYDRODIURIL) tablet 25 mg  25 mg Oral Daily Lindon Romp A, NP   25 mg at 03/12/18 2956  . hydrOXYzine (ATARAX/VISTARIL) tablet 25 mg  25 mg Oral TID PRN Rozetta Nunnery, NP      . Derrill Memo ON 03/13/2018] Influenza vac split quadrivalent PF (FLUARIX) injection 0.5 mL  0.5 mL Intramuscular Tomorrow-1000 Lindon Romp A, NP      . magnesium hydroxide (MILK OF MAGNESIA) suspension 30 mL  30 mL Oral Daily PRN Lindon Romp A, NP      . multivitamin with minerals tablet 1 tablet  1 tablet Oral Daily Lindon Romp A, NP   1 tablet at 03/12/18 0816  . simvastatin (ZOCOR) tablet 40 mg  40 mg Oral Daily Lindon Romp A, NP   40 mg at 03/12/18 2130   PTA Medications: Medications Prior to Admission  Medication Sig Dispense Refill Last Dose  . atenolol (TENORMIN) 50 MG tablet Take 50 mg by mouth daily.   03/11/2018 at Unknown time  . cholecalciferol (VITAMIN D) 1000 UNITS tablet Take 1,000 Units by mouth daily.   03/11/2018 at Unknown time  .  clonazePAM (KLONOPIN) 1 MG tablet Take 1 mg by mouth 2 (two) times daily as needed for anxiety.    Past Month at Unknown time  . ferrous sulfate 325 (65 FE) MG tablet Take 325 mg by mouth daily with breakfast.   03/11/2018 at Unknown time  . hydrochlorothiazide (HYDRODIURIL) 25 MG tablet Take 25 mg by mouth daily. 1/2 tablet daily   03/11/2018 at Unknown time  . Multiple Vitamin (MULTI VITAMIN DAILY PO) Take by mouth.   03/11/2018 at Unknown time  . simvastatin (ZOCOR) 40 MG tablet Take 40 mg by mouth daily.   03/11/2018 at Unknown time    Musculoskeletal: Strength & Muscle Tone: within normal limits Gait & Station:  normal Patient leans: N/A  Psychiatric Specialty Exam: Physical Exam  Nursing note and vitals reviewed. Constitutional: She is oriented to person, place, and time. She appears well-developed and well-nourished.  HENT:  Head: Normocephalic and atraumatic.  Respiratory: Effort normal.  Neurological: She is alert and oriented to person, place, and time.    ROS  Blood pressure 102/70, pulse (!) 58, temperature 97.9 F (36.6 C), temperature source Oral, resp. rate 18, height 4\' 11"  (1.499 m), weight 67.6 kg.Body mass index is 30.09 kg/m.  General Appearance: Casual  Eye Contact:  Fair  Speech:  Normal Rate  Volume:  Normal  Mood:  Dysphoric  Affect:  Congruent  Thought Process:  Coherent and Descriptions of Associations: Loose  Orientation:  Full (Time, Place, and Person)  Thought Content:  Delusions, Hallucinations: Auditory Visual and Paranoid Ideation  Suicidal Thoughts:  No  Homicidal Thoughts:  No  Memory:  Immediate;   Fair Recent;   Fair Remote;   Fair  Judgement:  Impaired  Insight:  Lacking  Psychomotor Activity:  Normal  Concentration:  Concentration: Fair and Attention Span: Fair  Recall:  [employer] of Knowledge:  Fair  Language:  Good  Akathisia:  Negative  Handed:  Right  AIMS (if indicated):     Assets:  Communication Skills Desire for  Improvement Housing Leisure Time Physical Health Resilience  ADL's:  Intact  Cognition:  WNL  Sleep:       Treatment Plan Summary: Daily contact with patient to assess and evaluate symptoms and progress in treatment, Medication management and Plan : Patient is seen and examined.  Patient is a 63 year old female with new onset psychosis who was admitted on 03/12/2018.  She will be admitted to the hospital.  She will be integrated into the milieu.  She will be encouraged to attend groups.  It is unclear at this point whether or not this is a delirium secondary to alcohol, cocaine and benzodiazepines or whether or not this is new onset thought disorder.  She does have looseness of associations, paranoia as well as auditory hallucinations.  New onset thought disorder at this age would be uncommon.  She stated her sleep was good, and she denied other bipolar symptoms.  I am concerned for the possibility of CVA by mixing the cocaine and her history of hypertension.  I would like to get a noncontrasted CT scan of the brain just to rule out any major damage.  Her blood pressure is been relatively low during the course the hospitalization so far, but this will be monitored.  Additionally she probably needs a TSH to make sure with regard to that.  Her mental status examination in our evaluation today appears to be quite normal.  She does not appear to have any cognitive deficits.  She has no neuromuscular deficits.  Her liver function enzymes are mildly elevated with an AST of 46 and an ALT of 47.  Her total protein is mildly elevated, and her MCV is decreased at 77.  Her MCH and MCHC are also slightly low.  Her platelets are normal.  Her creatinine is stable at 1.1 but she does have a slightly low potassium at 3.1.  We will also put forth her clonazepam on a as needed basis just to make sure she does not go into any withdrawal in case she has been using it more frequently.  Her blood alcohol was negative and her  drug screen was positive for benzodiazepines as well as cocaine.  We will start her on oral antipsychotic medicine to treat her underlying psychotic symptoms.  Observation Level/Precautions:  15 minute checks  Laboratory:  Chemistry Profile  Psychotherapy:    Medications:    Consultations:    Discharge Concerns:    Estimated LOS:  Other:     Physician Treatment Plan for Primary Diagnosis: <principal problem not specified> Long Term Goal(s): Improvement in symptoms so as ready for discharge  Short Term Goals: Ability to identify changes in lifestyle to reduce recurrence of condition will improve, Ability to verbalize feelings will improve, Ability to disclose and discuss suicidal ideas, Ability to demonstrate self-control will improve, Ability to identify and develop effective coping behaviors will improve, Ability to maintain clinical measurements within normal limits will improve and Ability to identify triggers associated with substance abuse/mental health issues will improve  Physician Treatment Plan for Secondary Diagnosis: Active Problems:   Severe major depression with psychotic features, mood-congruent ([place])  Long Term Goal(s): Improvement in symptoms so as ready for discharge  Short Term Goals: Ability to identify changes in lifestyle to reduce recurrence of condition will improve, Ability to verbalize feelings will improve, Ability to disclose and discuss suicidal ideas, Ability to demonstrate self-control will improve, Ability to identify and develop effective coping behaviors will improve, Ability to maintain clinical measurements within normal limits will improve and Ability to identify triggers associated with substance abuse/mental health issues will improve  I certify that inpatient services furnished can reasonably be expected to improve the patient's condition.    Sharma Covert, MD 2/1/20201:39 PM

## 2018-03-12 NOTE — Progress Notes (Signed)
Patient ID: Jody Simmons, female   DOB: Feb 02, 1956, 63 y.o.   MRN: [id number]   Jody Simmons is a 63 year old voluntary admission from [facility]. Patient doesn't have a psych history except some grief counseling after parents deaths. Her children brought her to [facility]. She reports they had an "intervention" with her due to her cocaine use. The hospital reports that she made paranoid statements that she thought her husband was putting something in her drink and placing papers down on the floor. She states she doesn't feel like she is paranoid and says that her and her husband have been arguing a lot lately. Denies any auditory or visual hallucinations. No overt psychosis noted at this time. She feels that her children are concerned that she may be getting dementia like her parents. She reports feeling ashamed that she had to tell her children about using cocaine. Admits to using cocaine ($25 worth) a couple of times a week when confronting her with her drug screen result. Drug screen positive both cocaine and benzodiazepines (prescription of klonopin). Has a medical history of Anemia, Hyperlipidemia, and HTN. Denies any SI or HI. Cooperative with admission process.

## 2018-03-12 NOTE — Tx Team (Signed)
Initial Treatment Plan 03/12/2018 6:12 AM Jody Simmons BMZ:[id number]    PATIENT STRESSORS: Marital or family conflict Substance abuse   PATIENT STRENGTHS: Ability for insight Capable of independent living Communication skills Motivation for treatment/growth Supportive family/friends   PATIENT IDENTIFIED PROBLEMS:    "cocaine use"   "my family thinks I'm paranoid"                 DISCHARGE CRITERIA:  Ability to meet basic life and health needs Reduction of life-threatening or endangering symptoms to within safe limits  PRELIMINARY DISCHARGE PLAN: Outpatient therapy Return to previous living arrangement  PATIENT/FAMILY INVOLVEMENT: This treatment plan has been presented to and reviewed with the patient, Jody Simmons, and/or family member, .  The patient and family have been given the opportunity to ask questions and make suggestions.  Franciso Bend, RN 03/12/2018, 6:12 AM

## 2018-03-12 NOTE — BHH Suicide Risk Assessment (Signed)
[facility] Admission Suicide Risk Assessment   Nursing information obtained from:  Patient Demographic factors:  Unemployed Current Mental Status:  NA Loss Factors:  NA Historical Factors:  NA Risk Reduction Factors:  Living with another person, especially a relative, Sense of responsibility to family, Positive social support  Total Time spent with patient: 30 minutes Principal Problem: <principal problem not specified> Diagnosis:  Active Problems:   Severe major depression with psychotic features, mood-congruent (HCC)  Subjective Data: Patient is seen and examined.  Patient is a 63 year old female with a past psychiatric history significant for cocaine dependence who presented to the [facility] emergency department on 03/12/2018 after her children brought her in for psychiatric assessment.  The patient stated that her children were concerned about her recent behavior.  The patient has a family history of dementia and her children were worried that she was developing dementia.  Patient stated that she has been using cocaine for several years.  She stated that she has been using cocaine on a semi-regular basis over the last 7 years.  She stated that she had quit using before, and that her husband found out she had used.  That it caused conflict at least according to the patient.  She stated that he had been very paranoid about her.  She stated that he had placed a tracking device on her car that she had found, and that she felt as though he was putting substances in her drinks and food because the way that they tasted.  She also admitted that there were motion detectors in the home that monitor her movement.  She has a history of hypertension, but denied any previous strokes or heart attacks.  She stated that she had not used cocaine in a month, however her drug screen was positive for cocaine and benzodiazepines (she was prescribed clonazepam).  She stated that she drank alcohol but they were  only "the [place] cans".  He denied any other substances.  She is retired from working at [employer].  She denied any family history of thought disorders.  She denied any previous psychiatric admissions or treatment.  She was admitted to the hospital for evaluation and stabilization.  Continued Clinical Symptoms:  Alcohol Use Disorder Identification Test Final Score (AUDIT): 1 The "Alcohol Use Disorders Identification Test", Guidelines for Use in Primary Care, Second Edition.  World Pharmacologist [facility]). Score between 0-7:  no or low risk or alcohol related problems. Score between 8-15:  moderate risk of alcohol related problems. Score between 16-19:  high risk of alcohol related problems. Score 20 or above:  warrants further diagnostic evaluation for alcohol dependence and treatment.   CLINICAL FACTORS:   Alcohol/Substance Abuse/Dependencies Schizophrenia:   Paranoid or undifferentiated type   Musculoskeletal: Strength & Muscle Tone: within normal limits Gait & Station: normal Patient leans: N/A  Psychiatric Specialty Exam: Physical Exam  Nursing note and vitals reviewed. Constitutional: She is oriented to person, place, and time. She appears well-developed and well-nourished.  HENT:  Head: Normocephalic and atraumatic.  Respiratory: Effort normal.  Neurological: She is alert and oriented to person, place, and time.    ROS  Blood pressure 102/70, pulse (!) 58, temperature 97.9 F (36.6 C), temperature source Oral, resp. rate 18, height 4\' 11"  (1.499 m), weight 67.6 kg.Body mass index is 30.09 kg/m.  General Appearance: Casual  Eye Contact:  Fair  Speech:  Normal Rate  Volume:  Normal  Mood:  Anxious  Affect:  Congruent  Thought  Process:  Coherent and Descriptions of Associations: Loose  Orientation:  Full (Time, Place, and Person)  Thought Content:  Delusions, Hallucinations: Auditory and Paranoid Ideation  Suicidal Thoughts:  No  Homicidal Thoughts:  No   Memory:  Immediate;   Fair Recent;   Fair Remote;   Fair  Judgement:  Impaired  Insight:  Lacking  Psychomotor Activity:  Normal  Concentration:  Concentration: Fair and Attention Span: Fair  Recall:  [employer] of Knowledge:  Fair  Language:  Good  Akathisia:  Negative  Handed:  Right  AIMS (if indicated):     Assets:  Communication Skills Desire for Improvement Financial Resources/Insurance Housing Leisure Time Physical Health Resilience Social Support  ADL's:  Intact  Cognition:  WNL  Sleep:         COGNITIVE FEATURES THAT CONTRIBUTE TO RISK:  None    SUICIDE RISK:   Mild:  Suicidal ideation of limited frequency, intensity, duration, and specificity.  There are no identifiable plans, no associated intent, mild dysphoria and related symptoms, good self-control (both objective and subjective assessment), few other risk factors, and identifiable protective factors, including available and accessible social support.  PLAN OF CARE: Patient is seen and examined.  Patient is a 63 year old female with new onset psychosis who was admitted on 03/12/2018.  She will be admitted to the hospital.  She will be integrated into the milieu.  She will be encouraged to attend groups.  It is unclear at this point whether or not this is a delirium secondary to alcohol, cocaine and benzodiazepines or whether or not this is new onset thought disorder.  She does have looseness of associations, paranoia as well as auditory hallucinations.  New onset thought disorder at this age would be uncommon.  She stated her sleep was good, and she denied other bipolar symptoms.  I am concerned for the possibility of CVA by mixing the cocaine and her history of hypertension.  I would like to get a noncontrasted CT scan of the brain just to rule out any major damage.  Her blood pressure is been relatively low during the course the hospitalization so far, but this will be monitored.  Additionally she probably needs a  TSH to make sure with regard to that.  Her mental status examination in our evaluation today appears to be quite normal.  She does not appear to have any cognitive deficits.  She has no neuromuscular deficits.  Her liver function enzymes are mildly elevated with an AST of 46 and an ALT of 47.  Her total protein is mildly elevated, and her MCV is decreased at 77.  Her MCH and MCHC are also slightly low.  Her platelets are normal.  Her creatinine is stable at 1.1 but she does have a slightly low potassium at 3.1.  We will also put forth her clonazepam on a as needed basis just to make sure she does not go into any withdrawal in case she has been using it more frequently.  Her blood alcohol was negative and her drug screen was positive for benzodiazepines as well as cocaine.  We will start her on oral antipsychotic medicine to treat her underlying psychotic symptoms.  I certify that inpatient services furnished can reasonably be expected to improve the patient's condition.   Sharma Covert, MD 03/12/2018, 9:26 AM

## 2018-03-12 NOTE — Plan of Care (Signed)
D: Patient presents lethargic and depressed. She had just gone to sleep prior to medication pass, and had arrived overnight. Patient denies SI/HI/AVH. Her appetite is good, energy normal and concentration good. She rates her depression 2/10 and hopelessness and anxiety 1/10. She denies physical symptoms or withdrawal complaints.  A: Patient checked q15 min, and checks reviewed. Reviewed medication changes with patient and educated on side effects. Educated patient on importance of attending group therapy sessions and educated on several coping skills. Encouarged participation in milieu through recreation therapy and attending meals with peers. Support and encouragement provided. Fluids offered. R: Patient receptive to education on medications, and is medication compliant. Patient contracts for safety on the unit. "How did I get myself to this point?" and "concentrate on myself and my family."  Problem: Education: Goal: Emotional status will improve Outcome: Not Progressing Goal: Mental status will improve Outcome: Not Progressing   Problem: Activity: Goal: Interest or engagement in activities will improve Outcome: Not Progressing Goal: Sleeping patterns will improve Outcome: Not Progressing

## 2018-03-12 NOTE — BHH Group Notes (Signed)
  [facility] LCSW Group Therapy Note  Date/Time:  03/12/2018 11:15AM-12:00PM  Type of Therapy and Topic:  Group Therapy:  Feelings About Hospitalization  Participation Level:  Minimal   Description of Group This process group involved patients discussing their feelings related to being hospitalized, as well as the benefits they see to being in the hospital.  These feelings and benefits were itemized.  The group then brainstormed specific ways in which they could seek those same benefits when they discharge and return home.  Therapeutic Goals 1. Patient will identify and describe positive and negative feelings related to hospitalization 2. Patient will verbalize benefits of hospitalization to themselves personally 3. Patients will brainstorm together ways they can obtain similar benefits in the outpatient setting, identify barriers to wellness and possible solutions  Summary of Patient Progress:  The patient expressed her primary feelings about being hospitalized are not yet known, as she just arrived at 6am this morning.  She said to check back with her tomorrow.  Therapeutic Modalities Cognitive Behavioral Therapy Motivational Interviewing    Selmer Dominion, LCSW 03/12/2018, 2:22 PM

## 2018-03-12 NOTE — ED Notes (Signed)
All belongings and valuables sent home with family. Daughter personally collected. Pt's shoes and eye glasses were only belongings left at bedside and will be taken with pt to [facility].

## 2018-03-12 NOTE — ED Provider Notes (Signed)
[facility] EMERGENCY DEPARTMENT Provider Note   CSN: [id number] Arrival date & time: 03/12/18  0114     History   Chief Complaint Chief Complaint  Patient presents with  . Paranoid  . Hallucinations    HPI Jody Simmons is a 63 y.o. female.  Patient to ED with son and daughter. She states she came because her family is concerned that she is hallucinating and paranoid. No history of psychiatric illness. She admits to cocaine use one month ago. Per her children, they are concerned that the cocaine use is more prevalent than she will admit. No report of SI/HI by the patient or family. The family states she is seeing people hiding under the bed, things in the floor that are not there, and is convinced that her husband is putting substances in her food possibly poisoning her.   The history is provided by the patient and a relative. No language interpreter was used.    Past Medical History:  Diagnosis Date  . Anemia   . Carcinoid tumor    rectum  . Hyperlipidemia   . Hypertension     There are no active problems to display for this patient.   Past Surgical History:  Procedure Laterality Date  . fibroidectomy    . NASAL SEPTUM SURGERY       OB History   No obstetric history on file.      Home Medications    Prior to Admission medications   Medication Sig Start Date End Date Taking? Authorizing Provider  atenolol (TENORMIN) 50 MG tablet Take 50 mg by mouth daily.    [provider]  cholecalciferol (VITAMIN D) 1000 UNITS tablet Take 1,000 Units by mouth daily.    [provider]  clonazePAM (KLONOPIN) 1 MG tablet Take 1 mg by mouth daily.    [provider]  ferrous sulfate 325 (65 FE) MG tablet Take 325 mg by mouth daily with breakfast.    [provider]  hydrochlorothiazide (HYDRODIURIL) 25 MG tablet Take 25 mg by mouth daily. 1/2 tablet daily    [provider]  Multiple Vitamin (MULTI VITAMIN DAILY  PO) Take by mouth.    [provider]  simvastatin (ZOCOR) 40 MG tablet Take 40 mg by mouth daily.    [provider]    Family History Family History  Problem Relation Age of Onset  . Hypertension Mother   . Hypertension Father   . Breast cancer Maternal Grandmother     Social History Social History   Tobacco Use  . Smoking status: Never Smoker  . Smokeless tobacco: Never Used  Substance Use Topics  . Alcohol use: Not on file  . Drug use: Yes    Types: Cocaine     Allergies   Patient has no known allergies.   Review of Systems Review of Systems  Constitutional: Negative for chills and fever.  HENT: Negative.   Respiratory: Negative.   Cardiovascular: Negative.   Gastrointestinal: Negative.   Musculoskeletal: Negative.   Skin: Negative.   Neurological: Negative.   Psychiatric/Behavioral: Positive for hallucinations. Negative for suicidal ideas.     Physical Exam Updated Vital Signs BP (!) 150/87   Pulse 80   Temp 98.4 F (36.9 C) (Oral)   Resp 18   SpO2 98%   Physical Exam Vitals signs and nursing note reviewed.  Constitutional:      Appearance: She is well-developed.  HENT:     Head: Normocephalic.  Neck:  Musculoskeletal: Normal range of motion and neck supple.  Cardiovascular:     Rate and Rhythm: Normal rate and regular rhythm.  Pulmonary:     Effort: Pulmonary effort is normal.     Breath sounds: Normal breath sounds.  Abdominal:     General: Bowel sounds are normal.     Palpations: Abdomen is soft.     Tenderness: There is no abdominal tenderness. There is no guarding or rebound.  Musculoskeletal: Normal range of motion.  Skin:    General: Skin is warm and dry.     Findings: No rash.  Neurological:     Mental Status: She is alert and oriented to person, place, and time.  Psychiatric:        Attention and Perception: She perceives auditory hallucinations.        Thought Content: Thought content is paranoid.       ED Treatments / Results  Labs (all labs ordered are listed, but only abnormal results are displayed) Labs Reviewed  CBC WITH DIFFERENTIAL/PLATELET  SALICYLATE LEVEL  COMPREHENSIVE METABOLIC PANEL  ACETAMINOPHEN LEVEL  RAPID URINE DRUG SCREEN, HOSP PERFORMED  ETHANOL    EKG None  Radiology No results found.  Procedures Procedures (including critical care time)  Medications Ordered in ED Medications - No data to display   Initial Impression / Assessment and Plan / ED Course  I have reviewed the triage vital signs and the nursing notes.  Pertinent labs & imaging results that were available during my care of the patient were reviewed by me and considered in my medical decision making (see chart for details).     Patient to ED voluntarily with family who is concerned about her paranoid behavior and visual hallucinations. No history of same. Admission by the patient of recent/ongoing cocaine use.   Patient will need to be assessed by TTS consultation. Labs pending.   Patient's labs reviewed. Mild hypokalemia which will be supplemented. Otherwise, she is considered medically cleared for psychiatric treatment.   TTS recommends inpatient treatment and she has been accepted to [facility] with available bed now. [facility] called to transport the patient to [facility].  Final Clinical Impressions(s) / ED Diagnoses   Final diagnoses:  None   1. Hallucinations 2. Paranoia 3. Cocaine dependence 4. Hypokalemia   ED Discharge Orders    None       Charlann Lange, PA-C 03/12/18 0350    Veryl Speak, MD 03/12/18 [phone]

## 2018-03-12 NOTE — Discharge Instructions (Addendum)
Transfer to [facility]

## 2018-03-12 NOTE — ED Notes (Signed)
Nurse drawing labs. 

## 2018-03-12 NOTE — Plan of Care (Signed)
D: Pt denies SI/HI/AVH. Pt is pleasant and cooperative. Pt concerned about medications, " I don't want to take psychotic medication" . Pt educated on medications can be used for more than one purpose. Pt encouraged to talk with doctor to find out why he put her on the medication ordered. Pt encouraged to talk with doctor so he can get a better idea about what is going on with patient.   A: Pt was offered support and encouragement. Pt was given scheduled medications. Pt was encourage to attend groups. Q 15 minute checks were done for safety.   R:Pt attends groups and interacts well with peers and staff. Pt is taking medication. Pt receptive to treatment and safety maintained on unit.  Problem: Education: Goal: Emotional status will improve Outcome: Progressing   Problem: Education: Goal: Mental status will improve Outcome: Progressing   Problem: Activity: Goal: Interest or engagement in activities will improve Outcome: Progressing

## 2018-03-13 MED ORDER — RISPERIDONE 0.5 MG PO TABS
0.5000 mg | ORAL_TABLET | Freq: Every day | ORAL | Status: DC
  Administered 2018-03-13 – 2018-03-15 (×3): 0.5 mg via ORAL
  Filled 2018-03-13 (×5): qty 1

## 2018-03-13 NOTE — BHH Counselor (Signed)
Adult Comprehensive Assessment  Patient ID: Jody Simmons, female   DOB: December 24, 1955, 63 y.o.   MRN: [id number]  Information Source: Information source: Patient  Current Stressors:  Patient states their primary concerns and needs for treatment are:: Repair and rebuild my relationship back with children and husband Patient states their goals for this hospitilization and ongoing recovery are:: stay clean Educational / Learning stressors: no Employment / Job issues: No work related stress patient is retired Family Relationships: Yes, associated with substance use Financial / Lack of resources (include bankruptcy): none Housing / Lack of housing: none Physical health (include injuries & life threatening diseases): none Social relationships: none Substance abuse: Cocaine use is causing stress in patient's life Bereavement / Loss: Lost parents in 2011 and 2012- "they were big parts of my life"  Living/Environment/Situation:  Living Arrangements: Spouse/significant other Living conditions (as described by patient or guardian): It is good Who else lives in the home?: no  How long has patient lived in current situation?: 25 years What is atmosphere in current home: Comfortable, Quarry manager, Supportive  Family History:  Marital status: Married Number of Years Married: 11 What types of issues is patient dealing with in the relationship?: none Are you sexually active?: Yes What is your sexual orientation?: heterosexual Has your sexual activity been affected by drugs, alcohol, medication, or emotional stress?: yes Does patient have children?: Yes How many children?: 2 How is patient's relationship with their children?: 25 year old son , 61 year old daughter- was good but since they are disappointed  Childhood History:  By whom was/is the patient raised?: Mother/father and step-parent Additional childhood history information: Parents were married 67 years, involved in the church  Description of  patient's relationship with caregiver when they were a child: close relationship with both of them Patient's description of current relationship with people who raised him/her: deceased How were you disciplined when you got in trouble as a child/adolescent?: spanked Does patient have siblings?: Yes Number of Siblings: 1 Description of patient's current relationship with siblings: Strong bond with her older brother Did patient suffer any verbal/emotional/physical/sexual abuse as a child?: No Did patient suffer from severe childhood neglect?: No Has patient ever been sexually abused/assaulted/raped as an adolescent or adult?: No Was the patient ever a victim of a crime or a disaster?: No Witnessed domestic violence?: No Has patient been effected by domestic violence as an adult?: No  Education:  Highest grade of school patient has completed: college  Currently a Ship broker?: No Learning disability?: No  Employment/Work Situation:   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 patient has a held a job?: Retired after 1 years from [employer]  Did You Receive Any Psychiatric Treatment/Services While in Passenger transport manager?: No Are There Guns or Other Weapons in [place]?: No  Financial Resources:   Museum/gallery curator resources: Charity fundraiser) Does patient have a Programmer, applications or guardian?: No  Alcohol/Substance Abuse:   What has been your use of drugs/alcohol within the last 12 months?: weekly cocaine use If attempted suicide, did drugs/alcohol play a role in this?: No Alcohol/Substance Abuse Treatment Hx: Denies past history Has alcohol/substance abuse ever caused legal problems?: No  Social Support System:   Pensions consultant Support System: Good Describe Community Support System: Family Type of faith/religion: Darrick Meigs How does patient's faith help to cope with current illness?: Keep praying and don't give [place]  Leisure/Recreation:   Leisure and Hobbies: shop, travel    Strengths/Needs:   What is the patient's perception  of their strengths?: Gift of hospitality,  Patient states they can use these personal strengths during their treatment to contribute to their recovery: stop beating myself up Patient states these barriers may affect/interfere with their treatment: no Patient states these barriers may affect their return to the community: no Other important information patient would like considered in planning for their treatment: Patient expressed interest in outpatient care including intensive outpatient   Discharge Plan:   Currently receiving community mental health services: No(Interested in IOP) Patient states concerns and preferences for aftercare planning are: Intentensive outpatient  Patient states they will know when they are safe and ready for discharge when: ready now Does patient have access to transportation?: Yes Does patient have financial barriers related to discharge medications?: No Patient description of barriers related to discharge medications: none described Will patient be returning to same living situation after discharge?: Yes  Summary/Recommendations:   Summary and Recommendations (to be completed by the evaluator): patient is a  married female who presented to [facility] ED accompanied by her two adult children, who did not participate in the assessment at Pt's request. Patient says her children are concerned because her parents both had dementia and they think she may have mental health problems. Patient's primary stressor is due to her husband because he finding out she was using cocaine. Patient stated she believes he is putting substances in her drink and that her husband is putting things in the floor that other people don't see. Her family reported to ED staff that Patient is hearing a beeping noise in the house and that she is seeing people hiding under the bed. Patient acknowledges that she has been feeling anxious. She says  she takes Advil PM to sleep. She acknowledges her appetite has decreased. She denies current suicidal ideation or history of suicide attempts. She denies homicidal ideation or history of aggression.   Patient will benefit from crisis stabilization, medication evaluation, group therapy and psychoeducation, in addition to case management for discharge planning. At discharge it is recommended that Patient adhere to the established discharge plan and continue in treatment.  Anticipated outcomes: Mood will be stabilized, crisis will be stabilized, medications will be established if appropriate, coping skills will be taught and practiced, family session will be done to determine discharge plan, mental illness will be normalized, patient will be better equipped to recognize symptoms and ask for assistance.   Rolanda Jay. 03/13/2018

## 2018-03-13 NOTE — Progress Notes (Signed)
D: Pt denies SI/HI/AVH. Pt is pleasant and cooperative. Pt concerned about what is going to happen. Pt husband requesting family session , pt did not think she is ready for one now. Pt visible in dayroom this evening, pt stated she had a good day after her Bp came up .   A: Pt was offered support and encouragement. Pt was given scheduled medications. Pt was encourage to attend groups. Q 15 minute checks were done for safety.   R:Pt attends groups and interacts well with peers and staff. Pt is taking medication. Pt has no complaints.Pt receptive to treatment and safety maintained on unit.   Problem: Education: Goal: Emotional status will improve 03/13/2018 2113 by Providence Crosby, RN Outcome: Progressing    Problem: Education: Goal: Mental status will improve 03/13/2018 2113 by Providence Crosby, RN Outcome: Progressing    Problem: Activity: Goal: Sleeping patterns will improve Outcome: Progressing   Problem: Coping: Goal: Ability to demonstrate self-control will improve Outcome: Progressing

## 2018-03-13 NOTE — Progress Notes (Signed)
Pt's Atenolol and hydrochlorothiazide with held this ,orning due to low blood pressure, pt offered fluids, rechecked 30 min later and it up to 90/60. Will continue to monitor.

## 2018-03-13 NOTE — Progress Notes (Signed)
DAR NOTE: Patient presents with anxious affect and depressed mood. Pt has been interacting with peers in the dayroom well. Pt had an episode of syncope this morning due to low BP, pt was given fluids, rechecked later and BP came up to 90/60.Denies pain, auditory and visual hallucinations.  Rates depression at 0, hopelessness at 0, and anxiety at 0.  Maintained on routine safety checks.  Medications given as prescribed.  Support and encouragement offered as needed.  Attended group and participated.  States goal for today is " I want to do everything possible to lead a productive life,"  Patient observed socializing with peers in the dayroom.  Offered no complaint.

## 2018-03-13 NOTE — [facility] Group Notes (Signed)
[facility] LCSW Group Therapy Note  Date/Time:  03/13/2018  11:00AM-12:00PM  Type of Therapy and Topic:  Group Therapy:  Music and Mood  Participation Level:  Active   Description of Group: In this process group, members listened to a variety of genres of music and identified that different types of music evoke different responses.  Patients were encouraged to identify music that was soothing for them and music that was energizing for them.  Patients discussed how this knowledge can help with wellness and recovery in various ways including managing depression and anxiety as well as encouraging healthy sleep habits.    Therapeutic Goals: 1. Patients will explore the impact of different varieties of music on mood 2. Patients will verbalize the thoughts they have when listening to different types of music 3. Patients will identify music that is soothing to them as well as music that is energizing to them 4. Patients will discuss how to use this knowledge to assist in maintaining wellness and recovery 5. Patients will explore the use of music as a coping skill  Summary of Patient Progress:  At the beginning of group, patient expressed that she felt fine but at the end said she felt more relaxed and positive.  She stated that she needs to expand her music tastes because much of what she heard she was not familiar with, but found it helpful nonetheless.  Therapeutic Modalities: Solution Focused Brief Therapy Activity   Selmer Dominion, LCSW

## 2018-03-13 NOTE — Progress Notes (Signed)
[facility] MD Progress Note  03/13/2018 11:40 AM Jody Simmons  MRN:  [id number] Subjective: Patient is seen and examined.  Patient is a 63 year old female with a past psychiatric history significant for cocaine dependence and most likely psychotic episode secondary to cocaine dependence who was admitted on 03/12/2018 with paranoia.  Objective: Patient is seen and examined.  Patient is a 63 year old female with the above-stated past psychiatric history who is seen in follow-up.  She feels a little bit better today with regards to her thinking.  She stated she is talked to her children as well as her husband.  She stated she had no fears or concerns about returning to the house.  She stated that the Seroquel hit her pretty hard last night made her feel quite dizzy.  Her blood pressure was decreased this morning, and her blood pressure medicines were held.  We discussed stopping that.  She has asked [place] to contact her husband and speak with him, and she also stated she is interested in outpatient subs abuse treatment when she is discharged from the hospital.  Her CT scan of the head with contrast as well is without was completely negative and showed no evidence of stroke.  Her blood pressure this morning is 103/71, heart rate is 81, she is afebrile.  She slept 6.25 hours last night.  On admission her MCV is low at 77, [facility] is low at 22.8 and her MCHC is down to 29.6.  She denied any auditory or visual hallucinations.  She denied any suicidal or homicidal ideation.  Principal Problem: <principal problem not specified> Diagnosis: Active Problems:   Severe major depression with psychotic features, mood-congruent (HCC)  Total Time spent with patient: 15 minutes  Past Psychiatric History: See admission H&P  Past Medical History:  Past Medical History:  Diagnosis Date  . Anemia   . Carcinoid tumor    rectum  . Hyperlipidemia   . Hypertension     Past Surgical History:  Procedure Laterality Date  . fibroidectomy     . NASAL SEPTUM SURGERY     Family History:  Family History  Problem Relation Age of Onset  . Hypertension Mother   . Hypertension Father   . Breast cancer Maternal Grandmother    Family Psychiatric  History: See admission H&P Social History:  Social History   Substance and Sexual Activity  Alcohol Use Not Currently     Social History   Substance and Sexual Activity  Drug Use Yes  . Types: Cocaine    Social History   Socioeconomic History  . Marital status: Married    Spouse name: Not on file  . Number of children: Not on file  . Years of education: Not on file  . Highest education level: Not on file  Occupational History  . Not on file  Social Needs  . Financial resource strain: Not on file  . Food insecurity:    Worry: Not on file    Inability: Not on file  . Transportation needs:    Medical: Not on file    Non-medical: Not on file  Tobacco Use  . Smoking status: Never Smoker  . Smokeless tobacco: Never Used  Substance and Sexual Activity  . Alcohol use: Not Currently  . Drug use: Yes    Types: Cocaine  . Sexual activity: Not on file  Lifestyle  . Physical activity:    Days per week: Not on file    Minutes per session: Not on file  .  Stress: Not on file  Relationships  . Social connections:    Talks on phone: Not on file    Gets together: Not on file    Attends religious service: Not on file    Active member of club or organization: Not on file    Attends meetings of clubs or organizations: Not on file    Relationship status: Not on file  Other Topics Concern  . Not on file  Social History Narrative  . Not on file   Additional Social History:    History of alcohol / drug use?: Yes Negative Consequences of Use: Personal relationships Name of Substance 1: cocaine 1 - Amount (size/oz): $25 dollars worth 1 - Frequency: twice a week 1 - Duration: last 5 years 1 - Last Use / Amount: thursday 1/30                  Sleep:  Good  Appetite:  Fair  Current Medications: Current Facility-Administered Medications  Medication Dose Route Frequency Provider Last Rate Last Dose  . acetaminophen (TYLENOL) tablet 650 mg  650 mg Oral Q6H PRN Lindon Romp A, NP      . alum & mag hydroxide-simeth (MAALOX/MYLANTA) 200-200-20 MG/5ML suspension 30 mL  30 mL Oral Q4H PRN Lindon Romp A, NP      . atenolol (TENORMIN) tablet 50 mg  50 mg Oral Daily Lindon Romp A, NP   50 mg at 03/12/18 0816  . cholecalciferol (VITAMIN D3) tablet 1,000 Units  1,000 Units Oral Daily Lindon Romp A, NP   1,000 Units at 03/13/18 0813  . ferrous sulfate tablet 325 mg  325 mg Oral Q breakfast Lindon Romp A, NP   325 mg at 03/13/18 0813  . hydrochlorothiazide (HYDRODIURIL) tablet 25 mg  25 mg Oral Daily Lindon Romp A, NP   25 mg at 03/12/18 7510  . hydrOXYzine (ATARAX/VISTARIL) tablet 25 mg  25 mg Oral TID PRN Rozetta Nunnery, NP      . Influenza vac split quadrivalent PF (FLUARIX) injection 0.5 mL  0.5 mL Intramuscular Tomorrow-1000 Lindon Romp A, NP      . magnesium hydroxide (MILK OF MAGNESIA) suspension 30 mL  30 mL Oral Daily PRN Lindon Romp A, NP      . multivitamin with minerals tablet 1 tablet  1 tablet Oral Daily Lindon Romp A, NP   1 tablet at 03/13/18 0813  . risperiDONE (RISPERDAL) tablet 0.5 mg  0.5 mg Oral QHS Sharma Covert, MD      . simvastatin (ZOCOR) tablet 40 mg  40 mg Oral Daily Lindon Romp A, NP   40 mg at 03/13/18 0813    Lab Results:  Results for orders placed or performed during the hospital encounter of 03/12/18 (from the past 48 hour(s))  CBC with Differential     Status: Abnormal   Collection Time: 03/12/18  1:55 AM  Result Value Ref Range   WBC 7.3 4.0 - 10.5 K/uL   RBC 5.35 (H) 3.87 - 5.11 MIL/uL   Hemoglobin 12.2 12.0 - 15.0 g/dL   HCT 41.2 36.0 - 46.0 %   MCV 77.0 (L) 80.0 - 100.0 fL   MCH 22.8 (L) 26.0 - 34.0 pg   MCHC 29.6 (L) 30.0 - 36.0 g/dL   RDW 13.5 11.5 - 15.5 %   Platelets 389 150 - 400 K/uL    nRBC 0.0 0.0 - 0.2 %   Neutrophils Relative % 58 %   Neutro Abs 4.2 1.7 -  7.7 K/uL   Lymphocytes Relative 29 %   Lymphs Abs 2.1 0.7 - 4.0 K/uL   Monocytes Relative 11 %   Monocytes Absolute 0.8 0.1 - 1.0 K/uL   Eosinophils Relative 1 %   Eosinophils Absolute 0.1 0.0 - 0.5 K/uL   Basophils Relative 1 %   Basophils Absolute 0.1 0.0 - 0.1 K/uL   Immature Granulocytes 0 %   Abs Immature Granulocytes 0.01 0.00 - 0.07 K/uL    Comment: Performed at [facility] [address] [place], [place] [address]  Salicylate level     Status: None   Collection Time: 03/12/18  1:55 AM  Result Value Ref Range   Salicylate Lvl <9.5 2.8 - 30.0 mg/dL    Comment: Performed at [facility] [address], [place], [place] [address]  Comprehensive metabolic panel     Status: Abnormal   Collection Time: 03/12/18  1:55 AM  Result Value Ref Range   Sodium 141 135 - 145 mmol/L   Potassium 3.1 (L) 3.5 - 5.1 mmol/L   Chloride 100 98 - 111 mmol/L   CO2 28 22 - 32 mmol/L   Glucose, Bld 108 (H) 70 - 99 mg/dL   BUN 17 8 - 23 mg/dL   Creatinine, Ser 1.10 (H) 0.44 - 1.00 mg/dL   Calcium 9.9 8.9 - 10.3 mg/dL   Total Protein 8.3 (H) 6.5 - 8.1 g/dL   Albumin 4.1 3.5 - 5.0 g/dL   AST 46 (H) 15 - 41 U/L   ALT 47 (H) 0 - 44 U/L   Alkaline Phosphatase 50 38 - 126 U/L   Total Bilirubin 0.7 0.3 - 1.2 mg/dL   GFR calc non Af Amer 53 (L) >60 mL/min   GFR calc Af Amer >60 >60 mL/min   Anion gap 13 5 - 15    Comment: Performed at [facility], [place] [address] [place], [place] [address]  Acetaminophen level     Status: Abnormal   Collection Time: 03/12/18  1:55 AM  Result Value Ref Range   Acetaminophen (Tylenol), Serum <10 (L) 10 - 30 ug/mL    Comment: (NOTE) Therapeutic concentrations vary significantly. A range of 10-30 ug/mL  may be an effective concentration for many patients. However, some  are best treated at concentrations outside of this range. Acetaminophen concentrations >150 ug/mL at 4  hours after ingestion  and >50 ug/mL at 12 hours after ingestion are often associated with  toxic reactions. Performed at [facility], [place] [address], [place], [place] [address]   Ethanol     Status: None   Collection Time: 03/12/18  1:55 AM  Result Value Ref Range   Alcohol, Ethyl (B) <10 <10 mg/dL    Comment: (NOTE) Lowest detectable limit for serum alcohol is 10 mg/dL. For medical purposes only. Performed at [facility], [place] [address], [place], [place] [address]   Urine rapid drug screen (hosp performed)     Status: Abnormal   Collection Time: 03/12/18  1:56 AM  Result Value Ref Range   Opiates NONE DETECTED NONE DETECTED   Cocaine POSITIVE (A) NONE DETECTED   Benzodiazepines POSITIVE (A) NONE DETECTED   Amphetamines NONE DETECTED NONE DETECTED   Tetrahydrocannabinol NONE DETECTED NONE DETECTED   Barbiturates NONE DETECTED NONE DETECTED    Comment: (NOTE) DRUG SCREEN FOR MEDICAL PURPOSES ONLY.  IF CONFIRMATION IS NEEDED FOR ANY PURPOSE, NOTIFY LAB WITHIN 5 DAYS. LOWEST DETECTABLE LIMITS FOR URINE  DRUG SCREEN Drug Class                     Cutoff (ng/mL) Amphetamine and metabolites    1000 Barbiturate and metabolites    200 Benzodiazepine                 932 Tricyclics and metabolites     300 Opiates and metabolites        300 Cocaine and metabolites        300 THC                            50 Performed at [facility], [place] [address] [place], [place] [address]     Blood Alcohol level:  Lab Results  Component Value Date   ETH <10 22/03/5425    Metabolic Disorder Labs: No results found for: HGBA1C, MPG No results found for: PROLACTIN No results found for: CHOL, TRIG, HDL, CHOLHDL, VLDL, LDLCALC  Physical Findings: AIMS: Facial and Oral Movements Muscles of Facial Expression: None, normal Lips and Perioral Area: None, normal Jaw: None, normal Tongue: None, normal,Extremity Movements Upper (arms, wrists, hands, fingers): None,  normal Lower (legs, knees, ankles, toes): None, normal, Trunk Movements Neck, shoulders, hips: None, normal, Overall Severity Severity of abnormal movements (highest score from questions above): None, normal Incapacitation due to abnormal movements: None, normal Patient's awareness of abnormal movements (rate only patient's report): No Awareness, Dental Status Current problems with teeth and/or dentures?: No Does patient usually wear dentures?: No  CIWA:  CIWA-Ar Total: 2 COWS:  COWS Total Score: 0  Musculoskeletal: Strength & Muscle Tone: within normal limits Gait & Station: normal Patient leans: N/A  Psychiatric Specialty Exam: Physical Exam  Nursing note and vitals reviewed. Constitutional: She is oriented to person, place, and time. She appears well-developed and well-nourished.  HENT:  Head: Normocephalic and atraumatic.  Respiratory: Effort normal.  Neurological: She is alert and oriented to person, place, and time.    ROS  Blood pressure 103/71, pulse 81, temperature 97.9 F (36.6 C), temperature source Oral, resp. rate 18, height 4\' 11"  (1.499 m), weight 67.6 kg.Body mass index is 30.09 kg/m.  General Appearance: Casual  Eye Contact:  Fair  Speech:  Normal Rate  Volume:  Normal  Mood:  Anxious  Affect:  Congruent  Thought Process:  Coherent and Descriptions of Associations: Circumstantial  Orientation:  Full (Time, Place, and Person)  Thought Content:  Logical  Suicidal Thoughts:  No  Homicidal Thoughts:  No  Memory:  Immediate;   Fair Recent;   Fair Remote;   Fair  Judgement:  Intact  Insight:  Fair  Psychomotor Activity:  Normal  Concentration:  Concentration: Fair and Attention Span: Fair  Recall:  [employer] of Knowledge:  Fair  Language:  Fair  Akathisia:  Negative  Handed:  Right  AIMS (if indicated):     Assets:  Communication Skills Desire for Improvement Financial Resources/Insurance Housing Intimacy Leisure Time Physical  Health Resilience Social Support  ADL's:  Intact  Cognition:  WNL  Sleep:  Number of Hours: 6.25     Treatment Plan Summary: Daily contact with patient to assess and evaluate symptoms and progress in treatment, Medication management and Plan : Patient is seen and examined.  Patient is a 63 year old female with the above-stated past psychiatric history who is seen in follow-up.  Patient seems less preoccupied with her paranoid thinking.  Hopefully  this is a psychotic disorder secondary to her cocaine use.  Her CT scan of the brain was negative.  She did not tolerate the Seroquel last night.  I am going to stop that.  I will put on board 0.5 mg p.o. nightly Risperdal and see if she tolerates that better.  Her laboratories revealed the anemia and she is on iron for that.  Her MCV, MCH and MCHC are all a little low.  I will put her on B12 and folate as well.  I did write for a TSH yesterday, but apparently was not done.  I will rewrite for that today.  No other changes in her medications.  We will monitor blood pressure this morning in case it starts going up and we have to reinstitute her BP meds. 1.  Continue atenolol 50 mg p.o. daily for hypertension, currently held secondary to low blood pressure. 2.  Continue vitamin D3 for vitamin D deficiency. 3.  Continue ferrous sulfate 325 mg p.o. daily with breakfast for anemia. 4.  Continue hydrochlorothiazide 25 mg p.o. daily for hypertension. 5.  Continue hydroxyzine 25 mg p.o. 3 times daily as needed anxiety. 6.  Continue multivitamin 1 tablet p.o. daily for anemia and nutritional supplementation. 7.  Stop Seroquel 8.  Risperdal 0.5 mg p.o. nightly for psychosis. 9.  Continue simvastatin 40 mg p.o. daily for hyperlipidemia. 10.  Repeat chemistries on 03/14/2026 to make sure about adverse events from CT contrast. 11.  Order TSH 12.  Disposition planning-in progress.  Sharma Covert, MD 03/13/2018, 11:40 AM

## 2018-03-14 DIAGNOSIS — F323 Major depressive disorder, single episode, severe with psychotic features: Principal | ICD-10-CM

## 2018-03-14 LAB — COMPREHENSIVE METABOLIC PANEL
ALT: 38 U/L (ref 0–44)
AST: 30 U/L (ref 15–41)
Albumin: 3.6 g/dL (ref 3.5–5.0)
Alkaline Phosphatase: 47 U/L (ref 38–126)
Anion gap: 8 (ref 5–15)
BUN: 15 mg/dL (ref 8–23)
CO2: 28 mmol/L (ref 22–32)
Calcium: 9 mg/dL (ref 8.9–10.3)
Chloride: 105 mmol/L (ref 98–111)
Creatinine, Ser: 0.83 mg/dL (ref 0.44–1.00)
GFR calc Af Amer: >60 mL/min (ref >60)
GFR calc non Af Amer: >60 mL/min (ref >60)
Glucose, Bld: 97 mg/dL (ref 70–99)
Potassium: 3.1 mmol/L — ABNORMAL LOW (ref 3.5–5.1)
Sodium: 141 mmol/L (ref 135–145)
Total Bilirubin: 0.2 mg/dL — ABNORMAL LOW (ref 0.3–1.2)
Total Protein: 7.2 g/dL (ref 6.5–8.1)

## 2018-03-14 LAB — TSH: TSH: 2.117 u[IU]/mL (ref 0.350–4.500)

## 2018-03-14 MED ORDER — AMANTADINE HCL 100 MG PO CAPS
100.0000 mg | ORAL_CAPSULE | Freq: Two times a day (BID) | ORAL | Status: DC
  Administered 2018-03-14 – 2018-03-16 (×4): 100 mg via ORAL
  Filled 2018-03-14 (×8): qty 1

## 2018-03-14 NOTE — Tx Team (Signed)
Interdisciplinary Treatment and Diagnostic Plan Update  03/14/2018 Time of Session: [place] MRN: [id number]  Principal Diagnosis: <principal problem not specified>  Secondary Diagnoses: Active Problems:   Severe major depression with psychotic features, mood-congruent (HCC)   Current Medications:  Current Facility-Administered Medications  Medication Dose Route Frequency Provider Last Rate Last Dose  . acetaminophen (TYLENOL) tablet 650 mg  650 mg Oral Q6H PRN Rozetta Nunnery, NP      . alum & mag hydroxide-simeth (MAALOX/MYLANTA) 200-200-20 MG/5ML suspension 30 mL  30 mL Oral Q4H PRN Lindon Romp A, NP      . amantadine (SYMMETREL) capsule 100 mg  100 mg Oral BID Johnn Hai, MD      . atenolol (TENORMIN) tablet 50 mg  50 mg Oral Daily Lindon Romp A, NP   50 mg at 03/12/18 0816  . cholecalciferol (VITAMIN D3) tablet 1,000 Units  1,000 Units Oral Daily Lindon Romp A, NP   1,000 Units at 03/14/18 0827  . ferrous sulfate tablet 325 mg  325 mg Oral Q breakfast Lindon Romp A, NP   325 mg at 03/14/18 0827  . hydrochlorothiazide (HYDRODIURIL) tablet 25 mg  25 mg Oral Daily Lindon Romp A, NP   Stopped at 03/14/18 0825  . hydrOXYzine (ATARAX/VISTARIL) tablet 25 mg  25 mg Oral TID PRN Rozetta Nunnery, NP      . Influenza vac split quadrivalent PF (FLUARIX) injection 0.5 mL  0.5 mL Intramuscular Tomorrow-1000 Lindon Romp A, NP      . magnesium hydroxide (MILK OF MAGNESIA) suspension 30 mL  30 mL Oral Daily PRN Lindon Romp A, NP      . multivitamin with minerals tablet 1 tablet  1 tablet Oral Daily Lindon Romp A, NP   1 tablet at 03/14/18 0827  . risperiDONE (RISPERDAL) tablet 0.5 mg  0.5 mg Oral QHS Sharma Covert, MD   0.5 mg at 03/13/18 2135  . simvastatin (ZOCOR) tablet 40 mg  40 mg Oral Daily Lindon Romp A, NP   40 mg at 03/14/18 4098   PTA Medications: Medications Prior to Admission  Medication Sig Dispense Refill Last Dose  . atenolol (TENORMIN) 50 MG tablet Take 50 mg  by mouth daily.   03/11/2018 at Unknown time  . cholecalciferol (VITAMIN D) 1000 UNITS tablet Take 1,000 Units by mouth daily.   03/11/2018 at Unknown time  . clonazePAM (KLONOPIN) 1 MG tablet Take 1 mg by mouth 2 (two) times daily as needed for anxiety.    Past Month at Unknown time  . ferrous sulfate 325 (65 FE) MG tablet Take 325 mg by mouth daily with breakfast.   03/11/2018 at Unknown time  . hydrochlorothiazide (HYDRODIURIL) 25 MG tablet Take 25 mg by mouth daily. 1/2 tablet daily   03/11/2018 at Unknown time  . Multiple Vitamin (MULTI VITAMIN DAILY PO) Take by mouth.   03/11/2018 at Unknown time  . simvastatin (ZOCOR) 40 MG tablet Take 40 mg by mouth daily.   03/11/2018 at Unknown time    Patient Stressors: Marital or family conflict Substance abuse  Patient Strengths: Ability for insight Capable of independent living Agricultural engineer for treatment/growth Supportive family/friends  Treatment Modalities: Medication Management, Group therapy, Case management,  1 to 1 session with clinician, Psychoeducation, Recreational therapy.   Physician Treatment Plan for Primary Diagnosis: <principal problem not specified> Long Term Goal(s): Improvement in symptoms so as ready for discharge Improvement in symptoms so as ready for discharge   Short  Term Goals: Ability to identify changes in lifestyle to reduce recurrence of condition will improve Ability to verbalize feelings will improve Ability to disclose and discuss suicidal ideas Ability to demonstrate self-control will improve Ability to identify and develop effective coping behaviors will improve Ability to maintain clinical measurements within normal limits will improve Ability to identify triggers associated with substance abuse/mental health issues will improve Ability to identify changes in lifestyle to reduce recurrence of condition will improve Ability to verbalize feelings will improve Ability to disclose and discuss  suicidal ideas Ability to demonstrate self-control will improve Ability to identify and develop effective coping behaviors will improve Ability to maintain clinical measurements within normal limits will improve Ability to identify triggers associated with substance abuse/mental health issues will improve  Medication Management: Evaluate patient's response, side effects, and tolerance of medication regimen.  Therapeutic Interventions: 1 to 1 sessions, Unit Group sessions and Medication administration.  Evaluation of Outcomes: Progressing  Physician Treatment Plan for Secondary Diagnosis: Active Problems:   Severe major depression with psychotic features, mood-congruent ([place])  Long Term Goal(s): Improvement in symptoms so as ready for discharge Improvement in symptoms so as ready for discharge   Short Term Goals: Ability to identify changes in lifestyle to reduce recurrence of condition will improve Ability to verbalize feelings will improve Ability to disclose and discuss suicidal ideas Ability to demonstrate self-control will improve Ability to identify and develop effective coping behaviors will improve Ability to maintain clinical measurements within normal limits will improve Ability to identify triggers associated with substance abuse/mental health issues will improve Ability to identify changes in lifestyle to reduce recurrence of condition will improve Ability to verbalize feelings will improve Ability to disclose and discuss suicidal ideas Ability to demonstrate self-control will improve Ability to identify and develop effective coping behaviors will improve Ability to maintain clinical measurements within normal limits will improve Ability to identify triggers associated with substance abuse/mental health issues will improve     Medication Management: Evaluate patient's response, side effects, and tolerance of medication regimen.  Therapeutic Interventions: 1 to 1  sessions, Unit Group sessions and Medication administration.  Evaluation of Outcomes: Progressing   RN Treatment Plan for Primary Diagnosis: <principal problem not specified> Long Term Goal(s): Knowledge of disease and therapeutic regimen to maintain health will improve  Short Term Goals: Ability to identify and develop effective coping behaviors will improve and Compliance with prescribed medications will improve  Medication Management: RN will administer medications as ordered by provider, will assess and evaluate patient's response and provide education to patient for prescribed medication. RN will report any adverse and/or side effects to prescribing provider.  Therapeutic Interventions: 1 on 1 counseling sessions, Psychoeducation, Medication administration, Evaluate responses to treatment, Monitor vital signs and CBGs as ordered, Perform/monitor CIWA, COWS, AIMS and Fall Risk screenings as ordered, Perform wound care treatments as ordered.  Evaluation of Outcomes: Progressing   LCSW Treatment Plan for Primary Diagnosis: <principal problem not specified> Long Term Goal(s): Safe transition to appropriate next level of care at discharge, Engage patient in therapeutic group addressing interpersonal concerns.  Short Term Goals: Engage patient in aftercare planning with referrals and resources, Increase social support and Increase skills for wellness and recovery  Therapeutic Interventions: Assess for all discharge needs, 1 to 1 time with Social worker, Explore available resources and support systems, Assess for adequacy in community support network, Educate family and significant other(s) on suicide prevention, Complete Psychosocial Assessment, Interpersonal group therapy.  Evaluation of Outcomes: Progressing  Progress in Treatment: Attending groups: Yes. Participating in groups: No. Taking medication as prescribed: Yes. Toleration medication: Yes. Family/Significant other contact  made: Yes, individual(s) contacted:  husband Patient understands diagnosis: Yes. Discussing patient identified problems/goals with staff: Yes. Medical problems stabilized or resolved: Yes. Denies suicidal/homicidal ideation: Yes. Issues/concerns per patient self-inventory: No. Other: none  New problem(s) identified: No, Describe:  none  New Short Term/Long Term Goal(s):  Patient Goals:  "to get myself ready to conquer my next challenge"  Discharge Plan or Barriers:   Reason for Continuation of Hospitalization: Delusions  Medication stabilization  Estimated Length of Stay: 2-4 days.  Attendees: Patient:Jody Simmons 03/14/2018   Physician: Dr. Jake Samples, MD 03/14/2018   Nursing: Elesa Massed, RN 03/14/2018   RN Care Manager: 03/14/2018   Social Worker: Lurline Idol, LCSW 03/14/2018   Recreational Therapist:  03/14/2018   Other:  03/14/2018   Other:  03/14/2018   Other: 03/14/2018        Scribe for Treatment Team: Joanne Chars, [place] 03/14/2018 2:39 PM

## 2018-03-14 NOTE — BHH Group Notes (Signed)
[place] LCSW Group Therapy Note  Date/Time: 03/14/18, 1300  Type of Therapy and Topic:  Group Therapy:  Overcoming Obstacles  Participation Level:  none  Description of Group:    In this group patients will be encouraged to explore what they see as obstacles to their own wellness and recovery. They will be guided to discuss their thoughts, feelings, and behaviors related to these obstacles. The group will process together ways to cope with barriers, with attention given to specific choices patients can make. Each patient will be challenged to identify changes they are motivated to make in order to overcome their obstacles. This group will be process-oriented, with patients participating in exploration of their own experiences as well as giving and receiving support and challenge from other group members.  Therapeutic Goals: 1. Patient will identify personal and current obstacles as they relate to admission. 2. Patient will identify barriers that currently interfere with their wellness or overcoming obstacles.  3. Patient will identify feelings, thought process and behaviors related to these barriers. 4. Patient will identify two changes they are willing to make to overcome these obstacles:    Summary of Patient Progress: Pt attended group but did not participate in the discussion.  Pt appeared attentive, however, when CSW called on pt and asked about obstacles she was facing, she said she did not want to share with the group.  No further comments or participation.       Therapeutic Modalities:   Cognitive Behavioral Therapy Solution Focused Therapy Motivational Interviewing Relapse Prevention Therapy  Lurline Idol, LCSW

## 2018-03-14 NOTE — Progress Notes (Signed)
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 Patient denies cravings denies wanting to harm herself or others denies psychotic symptoms at the present time  Principal Problem: Drug-induced paranoia Diagnosis: Active Problems:   Severe major depression with psychotic features, mood-congruent ([place])  Total Time spent with patient: 20 minutes Past Medical History:  Past Medical History:  Diagnosis Date  . Anemia   . Carcinoid tumor    rectum  . Hyperlipidemia   . Hypertension     Past Surgical History:  Procedure Laterality Date  . fibroidectomy    . NASAL SEPTUM SURGERY     Family History:  Family History  Problem Relation Age of Onset  . Hypertension Mother   . Hypertension Father   . Breast cancer Maternal Grandmother     Social History:  Social History   Substance and Sexual Activity  Alcohol Use Not Currently     Social History   Substance and Sexual Activity  Drug Use Yes  . Types: Cocaine    Social History   Socioeconomic History  . Marital status: Married    Spouse name: Not on file  . Number of children: Not on file  . Years of education: Not on file  . Highest education level: Not on file  Occupational History  . Not on file  Social Needs  . Financial resource strain: Not on file  . Food insecurity:    Worry: Not on file    Inability: Not on file  . Transportation needs:    Medical: Not on file    Non-medical: Not on file  Tobacco Use  . Smoking status: Never Smoker  . Smokeless tobacco: Never Used  Substance and Sexual Activity  . Alcohol use: Not Currently  . Drug use: Yes    Types: Cocaine  . Sexual  activity: Not on file  Lifestyle  . Physical activity:    Days per week: Not on file    Minutes per session: Not on file  . Stress: Not on file  Relationships  . Social connections:    Talks on phone: Not on file    Gets together: Not on file    Attends religious service: Not on file    Active member of club or organization: Not on file    Attends meetings of clubs or organizations: Not on file    Relationship status: Not on file  Other Topics Concern  . Not on file  Social History Narrative  . Not on file   Additional Social History:    History of alcohol / drug use?: Yes Negative Consequences of Use: Personal relationships Name of Substance 1: cocaine 1 - Amount (size/oz): $25 dollars worth 1 - Frequency: twice a week 1 - Duration: last 5 years 1 - Last Use / Amount: thursday 1/30                  Sleep: Fair  Appetite:  Fair  Current Medications: Current Facility-Administered Medications  Medication Dose Route Frequency Provider Last Rate Last Dose  . acetaminophen (TYLENOL) tablet 650 mg  650 mg Oral Q6H PRN Rozetta Nunnery,  NP      . alum & mag hydroxide-simeth (MAALOX/MYLANTA) 200-200-20 MG/5ML suspension 30 mL  30 mL Oral Q4H PRN Lindon Romp A, NP      . amantadine (SYMMETREL) capsule 100 mg  100 mg Oral BID Johnn Hai, MD      . atenolol (TENORMIN) tablet 50 mg  50 mg Oral Daily Lindon Romp A, NP   50 mg at 03/12/18 0816  . cholecalciferol (VITAMIN D3) tablet 1,000 Units  1,000 Units Oral Daily Lindon Romp A, NP   1,000 Units at 03/14/18 0827  . ferrous sulfate tablet 325 mg  325 mg Oral Q breakfast Lindon Romp A, NP   325 mg at 03/14/18 0827  . hydrochlorothiazide (HYDRODIURIL) tablet 25 mg  25 mg Oral Daily Lindon Romp A, NP   Stopped at 03/14/18 0825  . hydrOXYzine (ATARAX/VISTARIL) tablet 25 mg  25 mg Oral TID PRN Rozetta Nunnery, NP      . Influenza vac split quadrivalent PF (FLUARIX) injection 0.5 mL  0.5 mL Intramuscular Tomorrow-1000 Lindon Romp A, NP      . magnesium hydroxide (MILK OF MAGNESIA) suspension 30 mL  30 mL Oral Daily PRN Lindon Romp A, NP      . multivitamin with minerals tablet 1 tablet  1 tablet Oral Daily Lindon Romp A, NP   1 tablet at 03/14/18 0827  . risperiDONE (RISPERDAL) tablet 0.5 mg  0.5 mg Oral QHS Sharma Covert, MD   0.5 mg at 03/13/18 2135  . simvastatin (ZOCOR) tablet 40 mg  40 mg Oral Daily Lindon Romp A, NP   40 mg at 03/14/18 9798    Lab Results:  Results for orders placed or performed during the hospital encounter of 03/12/18 (from the past 48 hour(s))  TSH     Status: None   Collection Time: 03/14/18  6:39 AM  Result Value Ref Range   TSH 2.117 0.350 - 4.500 uIU/mL    Comment: Performed by a 3rd Generation assay with a functional sensitivity of <=0.01 uIU/mL. Performed at [facility], [place] [address], [place],  [address]   Comprehensive metabolic panel     Status: Abnormal   Collection Time: 03/14/18  6:39 AM  Result Value Ref Range   Sodium 141 135 - 145 mmol/L   Potassium 3.1 (L) 3.5 - 5.1 mmol/L   Chloride 105 98 - 111 mmol/L   CO2 28 22 - 32 mmol/L   Glucose, Bld 97 70 - 99 mg/dL   BUN 15 8 - 23 mg/dL   Creatinine, Ser 0.83 0.44 - 1.00 mg/dL   Calcium 9.0 8.9 - 10.3 mg/dL   Total Protein 7.2 6.5 - 8.1 g/dL   Albumin 3.6 3.5 - 5.0 g/dL   AST 30 15 - 41 U/L   ALT 38 0 - 44 U/L   Alkaline Phosphatase 47 38 - 126 U/L   Total Bilirubin 0.2 (L) 0.3 - 1.2 mg/dL   GFR calc non Af Amer >60 >60 mL/min   GFR calc Af Amer >60 >60 mL/min   Anion gap 8 5 - 15    Comment: Performed at [facility], [place] [address], [place],  [address]    Blood Alcohol level:  Lab Results  Component Value Date   ETH <10 81/44/8185    Metabolic Disorder Labs: No results found for: HGBA1C, MPG No results found for: PROLACTIN No results found for: CHOL, TRIG, HDL, CHOLHDL, VLDL, LDLCALC  Physical Findings:  AIMS: Facial and Oral  Movements Muscles of Facial Expression: None, normal Lips and Perioral Area: None, normal Jaw: None, normal Tongue: None, normal,Extremity Movements Upper (arms, wrists, hands, fingers): None, normal Lower (legs, knees, ankles, toes): None, normal, Trunk Movements Neck, shoulders, hips: None, normal, Overall Severity Severity of abnormal movements (highest score from questions above): None, normal Incapacitation due to abnormal movements: None, normal Patient's awareness of abnormal movements (rate only patient's report): No Awareness, Dental Status Current problems with teeth and/or dentures?: No Does patient usually wear dentures?: No  CIWA:  CIWA-Ar Total: 2 COWS:  COWS Total Score: 0  Musculoskeletal: Strength & Muscle Tone: within normal limits Gait & Station: normal Patient leans: N/A  Psychiatric Specialty Exam: Physical Exam  ROS  Blood pressure 101/62, pulse 74, temperature 97.8 F (36.6 C), temperature source Oral, resp. rate 18, height 4\' 11"  (1.499 m), weight 67.6 kg.Body mass index is 30.09 kg/m.  General Appearance: Fairly Groomed  Eye Contact:  Good  Speech:  Clear and Coherent  Volume:  Normal  Mood:  Euthymic  Affect:  Appropriate  Thought Process:  Goal Directed  Orientation:  Full (Time, Place, and Person)  Thought Content:  logical  Suicidal Thoughts:  No  Homicidal Thoughts:  No  Memory:  Recent;   Fair  Judgement:  Intact  Insight:  Good  Psychomotor Activity:  Decreased  Concentration:  Concentration: Good  Recall:  Good  Fund of Knowledge:  Good  Language:  Good  Akathisia:  Negative  Handed:  Right  AIMS (if indicated):     Assets:  Leisure Time Physical Health Resilience  ADL's:  Intact  Cognition:  WNL  Sleep:  Number of Hours: 6.75     Treatment Plan Summary: Daily contact with patient to assess and evaluate symptoms and progress in treatment, Medication management and Plan Continue current reality-based therapy add amantadine for  cocaine cravings arrange family meeting with husband at [phone] with caseworkers  Johnn Hai, MD 03/14/2018, 11:03 AM

## 2018-03-14 NOTE — Progress Notes (Signed)
D: Pt denies SI/HI/AVH. Pt is pleasant and cooperative. Pt stated she was feeling better, pt visible on unit interacting with peers .  Pt decided to take her Symmetrel this evening, per NP- Corene Cornea it was ok. Writer spoke to various family members this evening, they are concerned about Family session and things going forward. Process explained with understanding.   A: Pt was offered support and encouragement. Pt was given scheduled medications. Pt was encourage to attend groups. Q 15 minute checks were done for safety.   R:Pt attends groups and interacts well with peers and staff. Pt is taking medication. Pt has no complaints.Pt receptive to treatment and safety maintained on unit.  Problem: Education: Goal: Emotional status will improve Outcome: Progressing   Problem: Education: Goal: Mental status will improve Outcome: Progressing   Problem: Education: Goal: Verbalization of understanding the information provided will improve Outcome: Progressing   Problem: Activity: Goal: Interest or engagement in activities will improve Outcome: Progressing Goal: Sleeping patterns will improve Outcome: Progressing   Problem: Coping: Goal: Ability to verbalize frustrations and anger appropriately will improve Outcome: Progressing Goal: Ability to demonstrate self-control will improve Outcome: Progressing   Problem: Health Behavior/Discharge Planning: Goal: Compliance with treatment plan for underlying cause of condition will improve Outcome: Progressing   Problem: Physical Regulation: Goal: Ability to maintain clinical measurements within normal limits will improve Outcome: Progressing

## 2018-03-14 NOTE — BHH Suicide Risk Assessment (Signed)
[place] INPATIENT:  Family/Significant Other Suicide Prevention Education  Suicide Prevention Education:  Education Completed; Kalifa Cadden, husband, [phone], has been identified by the patient as the family member/significant other with whom the patient will be residing, and identified as the person(s) who will aid the patient in the event of a mental health crisis (suicidal ideations/suicide attempt).  With written consent from the patient, the family member/significant other has been provided the following suicide prevention education, prior to the and/or following the discharge of the patient.  The suicide prevention education provided includes the following:  Suicide risk factors  Suicide prevention and interventions  National Suicide Hotline telephone number  [facility] assessment telephone number  [facility] Emergency Assistance [place] and/or Residential Mobile Crisis Unit telephone number  Request made of family/significant other to:  Remove weapons (e.g., guns, rifles, knives), all items previously/currently identified as safety concern.    Remove drugs/medications (over-the-counter, prescriptions, illicit drugs), all items previously/currently identified as a safety concern.  The family member/significant other verbalizes understanding of the suicide prevention education information provided.  The family member/significant other agrees to remove the items of safety concern listed above.  No history of mental health problems before.  Husband believes the paranoia was due to her using drugs that were laced with something.  He is requesting a meeting to talk about options and so he can "decide what he is willing to do."  Joanne Chars, LCSW 03/14/2018, 1:12 PM

## 2018-03-14 NOTE — Progress Notes (Signed)
Recreation Therapy Notes  INPATIENT RECREATION THERAPY ASSESSMENT  Patient Details Name: Jody Simmons MRN: [id number] DOB: 1955/03/18 Today's Date: 03/14/2018       Information Obtained From: Patient  Able to Participate in Assessment/Interview: Yes  Patient Presentation: Alert  Reason for Admission (Per Patient): Other (Comments)(Family intervention)  Patient Stressors: (Pt stated she didn't really have any.)  Coping Skills:   TV, Sports, Arguments, Music, Meditate, Substance Abuse, Talk, Prayer, Avoidance, Hot Bath/Shower  Leisure Interests (2+):  Social - Family, Community - Shopping mall, Medical laboratory scientific officer - Travel (Comment)(Babysit, Shop)  Frequency of Recreation/Participation: (Shops, Babysit- Weekly; Travel- Few times a Month)  Awareness of Community Resources:  Yes  Community Resources:  Mall  Current Use: Yes  If no, Barriers?:    Expressed Interest in [employer] Information: No  County of Residence:  [place]  Patient Main Form of Transportation: Musician  Patient Strengths:  Hospitality; Caring  Patient Identified Areas of Improvement:  Not let people talk down to me  Patient Goal for Hospitalization:  "Learn to better handle myself with people; get family back"  Current SI (including self-harm):  No  Current HI:  No  Current AVH: No  Staff Intervention Plan: Collaborate with Interdisciplinary Treatment Team, Group Attendance  Consent to Intern Participation: N/A   Victorino Sparrow, LRT/CTRS  Victorino Sparrow A 03/14/2018, 1:47 PM

## 2018-03-14 NOTE — Progress Notes (Signed)
Recreation Therapy Notes  Date: 2.3.20 Time: 1000 Location: 500 Hall Dayroom  Group Topic: Anxiety  Goal Area(s) Addresses:  Patient will identify triggers for anxiety.  Patient will identify physical symptoms to anxiety.  Patient will identify coping skills to deal with anxiety.   Behavioral Response: Engaged  Intervention:  Worksheet, pencils  Activity:  Introduction to Anxiety.  Patients were to identify at least 3 things that trigger anxiety, physical symptoms they have when anxious, thoughts they have when anxious and coping skills the use to deal with anxiety.  Education: Anger Management, Discharge Planning   Education Outcome: Acknowledges education/In group clarification offered/Needs additional education.   Clinical Observations/Feedback: Patient stated she gets anxious when she doesn't know what's going to be the next step.  Pt identified her triggers as confrontation and disrespect.  Pt identified her symptoms as getting quiet, withdrawing from people and having hot flashes.  Pt identified her thoughts as blaming others/not taking responsibility.  Pt identified her coping skills as get on the phone (soical media, playing games), shopping and watching tv.    Victorino Sparrow, LRT/CTRS      Victorino Sparrow A 03/14/2018 11:19 AM

## 2018-03-15 NOTE — Progress Notes (Signed)
Patient ID: Jody Simmons, female   DOB: [birth year], 63 y.o.   MRN: [id number]   D: Patient denies SI/HI and auditory and visual hallucinations. Patient has a depressed mood and affect although she states that she is feeling better. She is interacting with her peers appropriately on the unit. Somehat paranoid. Asked lots of questions about medications.  A: Patient given emotional support from RN. Patient given medications per MD orders. Patient encouraged to attend groups and unit activities. Patient encouraged to come to staff with any questions or concerns.  R: Patient remains cooperative and appropriate. Will continue to monitor patient for safety.

## 2018-03-15 NOTE — Progress Notes (Signed)
[facility] MD Progress Note  03/15/2018 10:48 AM Jody Simmons  MRN:  [id number] Subjective:    Patient seen before team was focused on discharge she is alert and oriented denies paranoid thinking, then family meeting was conducted with myself social workers and patient's brother, daughter by phone, son in presence and husband.  Family focused on the patient obtaining rehab she does agree to go to [place], stay with brother and attend rehab in [place] No current paranoia no thoughts of harming self or others no cravings reported Family tends to confirm the paranoia was only the last week  Principal Problem: Cocaine induced psychosis Diagnosis: Active Problems:   Severe major depression with psychotic features, mood-congruent ([place])  Total Time spent with patient: 30 minutes  Past Medical History:  Past Medical History:  Diagnosis Date  . Anemia   . Carcinoid tumor    rectum  . Hyperlipidemia   . Hypertension     Past Surgical History:  Procedure Laterality Date  . fibroidectomy    . NASAL SEPTUM SURGERY     Family History:  Family History  Problem Relation Age of Onset  . Hypertension Mother   . Hypertension Father   . Breast cancer Maternal Grandmother     Social History:  Social History   Substance and Sexual Activity  Alcohol Use Not Currently     Social History   Substance and Sexual Activity  Drug Use Yes  . Types: Cocaine    Social History   Socioeconomic History  . Marital status: Married    Spouse name: Not on file  . Number of children: Not on file  . Years of education: Not on file  . Highest education level: Not on file  Occupational History  . Not on file  Social Needs  . Financial resource strain: Not on file  . Food insecurity:    Worry: Not on file    Inability: Not on file  . Transportation needs:    Medical: Not on file    Non-medical: Not on file  Tobacco Use  . Smoking status: Never Smoker  . Smokeless tobacco: Never Used  Substance and  Sexual Activity  . Alcohol use: Not Currently  . Drug use: Yes    Types: Cocaine  . Sexual activity: Not on file  Lifestyle  . Physical activity:    Days per week: Not on file    Minutes per session: Not on file  . Stress: Not on file  Relationships  . Social connections:    Talks on phone: Not on file    Gets together: Not on file    Attends religious service: Not on file    Active member of club or organization: Not on file    Attends meetings of clubs or organizations: Not on file    Relationship status: Not on file  Other Topics Concern  . Not on file  Social History Narrative  . Not on file   Additional Social History:    History of alcohol / drug use?: Yes Negative Consequences of Use: Personal relationships Name of Substance 1: cocaine 1 - Amount (size/oz): $25 dollars worth 1 - Frequency: twice a week 1 - Duration: last 5 years 1 - Last Use / Amount: thursday 1/30                  Sleep: Fair  Appetite:  Fair  Current Medications: Current Facility-Administered Medications  Medication Dose Route Frequency Provider Last Rate Last Dose  .  acetaminophen (TYLENOL) tablet 650 mg  650 mg Oral Q6H PRN Lindon Romp A, NP      . alum & mag hydroxide-simeth (MAALOX/MYLANTA) 200-200-20 MG/5ML suspension 30 mL  30 mL Oral Q4H PRN Lindon Romp A, NP      . amantadine (SYMMETREL) capsule 100 mg  100 mg Oral BID Johnn Hai, MD   100 mg at 03/15/18 0805  . atenolol (TENORMIN) tablet 50 mg  50 mg Oral Daily Lindon Romp A, NP   50 mg at 03/15/18 0806  . cholecalciferol (VITAMIN D3) tablet 1,000 Units  1,000 Units Oral Daily Lindon Romp A, NP   1,000 Units at 03/15/18 0806  . ferrous sulfate tablet 325 mg  325 mg Oral Q breakfast Lindon Romp A, NP   325 mg at 03/15/18 0805  . hydrochlorothiazide (HYDRODIURIL) tablet 25 mg  25 mg Oral Daily Lindon Romp A, NP   25 mg at 03/15/18 0737  . hydrOXYzine (ATARAX/VISTARIL) tablet 25 mg  25 mg Oral TID PRN Rozetta Nunnery, NP       . Influenza vac split quadrivalent PF (FLUARIX) injection 0.5 mL  0.5 mL Intramuscular Tomorrow-1000 Lindon Romp A, NP      . magnesium hydroxide (MILK OF MAGNESIA) suspension 30 mL  30 mL Oral Daily PRN Lindon Romp A, NP      . multivitamin with minerals tablet 1 tablet  1 tablet Oral Daily Lindon Romp A, NP   1 tablet at 03/15/18 0805  . risperiDONE (RISPERDAL) tablet 0.5 mg  0.5 mg Oral QHS Sharma Covert, MD   0.5 mg at 03/14/18 2106  . simvastatin (ZOCOR) tablet 40 mg  40 mg Oral Daily Lindon Romp A, NP   40 mg at 03/15/18 1062    Lab Results:  Results for orders placed or performed during the hospital encounter of 03/12/18 (from the past 48 hour(s))  TSH     Status: None   Collection Time: 03/14/18  6:39 AM  Result Value Ref Range   TSH 2.117 0.350 - 4.500 uIU/mL    Comment: Performed by a 3rd Generation assay with a functional sensitivity of <=0.01 uIU/mL. Performed at [facility], [place] [address], [place], [place] [address]   Comprehensive metabolic panel     Status: Abnormal   Collection Time: 03/14/18  6:39 AM  Result Value Ref Range   Sodium 141 135 - 145 mmol/L   Potassium 3.1 (L) 3.5 - 5.1 mmol/L   Chloride 105 98 - 111 mmol/L   CO2 28 22 - 32 mmol/L   Glucose, Bld 97 70 - 99 mg/dL   BUN 15 8 - 23 mg/dL   Creatinine, Ser 0.83 0.44 - 1.00 mg/dL   Calcium 9.0 8.9 - 10.3 mg/dL   Total Protein 7.2 6.5 - 8.1 g/dL   Albumin 3.6 3.5 - 5.0 g/dL   AST 30 15 - 41 U/L   ALT 38 0 - 44 U/L   Alkaline Phosphatase 47 38 - 126 U/L   Total Bilirubin 0.2 (L) 0.3 - 1.2 mg/dL   GFR calc non Af Amer >60 >60 mL/min   GFR calc Af Amer >60 >60 mL/min   Anion gap 8 5 - 15    Comment: Performed at [facility], [place] [address], [place], [place] [address]    Blood Alcohol level:  Lab Results  Component Value Date   ETH <10 35/00/9381    Metabolic Disorder Labs: No results found for: HGBA1C, MPG  No results found for: PROLACTIN No  results found for: CHOL, TRIG, HDL, CHOLHDL, VLDL, LDLCALC  Physical Findings: AIMS: Facial and Oral Movements Muscles of Facial Expression: None, normal Lips and Perioral Area: None, normal Jaw: None, normal Tongue: None, normal,Extremity Movements Upper (arms, wrists, hands, fingers): None, normal Lower (legs, knees, ankles, toes): None, normal, Trunk Movements Neck, shoulders, hips: None, normal, Overall Severity Severity of abnormal movements (highest score from questions above): None, normal Incapacitation due to abnormal movements: None, normal Patient's awareness of abnormal movements (rate only patient's report): No Awareness, Dental Status Current problems with teeth and/or dentures?: No Does patient usually wear dentures?: No  CIWA:  CIWA-Ar Total: 2 COWS:  COWS Total Score: 0  Musculoskeletal: Strength & Muscle Tone: within normal limits Psychiatric Specialty Exam: Physical Exam  ROS  Blood pressure 103/67, pulse 87, temperature 97.8 F (36.6 C), temperature source Oral, resp. rate 18, height 4\' 11"  (1.499 m), weight 67.6 kg.Body mass index is 30.09 kg/m.  General Appearance: Casual  Eye Contact:  Fair  Speech:  Clear and Coherent  Volume:  Normal  Mood:  Euthymic  Affect:  Constricted  Thought Process:  Coherent  Orientation:  Full (Time, Place, and Person)  Thought Content:  Logical  Suicidal Thoughts:  No  Homicidal Thoughts:  No  Memory:  Immediate;   Fair  Judgement:  Fair  Insight:  Fair  Psychomotor Activity:  Normal  Concentration:  Concentration: Good  Recall:  Good  Fund of Knowledge:  Good  Language:  Good  Akathisia:  Negative  Handed:  Right  AIMS (if indicated):     Assets:  Communication Skills Desire for Improvement Financial Resources/Insurance Housing Leisure Time Physical Health Resilience Social Support  ADL's:  Intact  Cognition:  WNL  Sleep:  Number of Hours: 6.75     Treatment Plan Summary: Daily contact with patient to  assess and evaluate symptoms and progress in treatment, Medication management and Plan Continue amantadine and low-dose Risperdal family meeting today was successful patient to be discharged to rehab likely tomorrow  Johnn Hai, MD 03/15/2018, 10:48 AM

## 2018-03-15 NOTE — Progress Notes (Signed)
D: Pt denies SI/HI/AVH. Pt is pleasant and cooperative. Pt very interactive on the unit this evening. Pt stated she got plans to go to [facility] in [place] with her brother. Pt hopes change of environment can help situation.  A: Pt was offered support and encouragement. Pt was given scheduled medications. Pt was encourage to attend groups. Q 15 minute checks were done for safety.  R:Pt attends groups and interacts well with peers and staff. Pt is taking medication. Pt has no complaints.Pt receptive to treatment and safety maintained on unit.

## 2018-03-15 NOTE — Progress Notes (Signed)
Recreation Therapy Notes  Date: 2.4.20 Time: 1000 Location: 500 Hall Dayroom  Group Topic: Wellness  Goal Area(s) Addresses:  Patient will define components of whole wellness. Patient will verbalize benefit of whole wellness.  Behavioral Response: Minimal  Intervention: Exercise, Music  Activity: Exercise.  LRT introduced the concept of wellness to patients.  LRT and patients then engaged in a series of stretches and exercises.  Patients took turns leading group in exercises of their choosing.  Patients could take breaks and get water as needed.  Education: Wellness, Dentist.   Education Outcome: Acknowledges education/In group clarification offered/Needs additional education.   Clinical Observations/Feedback:  Pt started the exercises with the group.  Pt was called out of group for a family session and did not return.    Victorino Sparrow, LRT/CTRS     Ria Comment, Latina Frank A 03/15/2018 10:56 AM

## 2018-03-15 NOTE — Progress Notes (Signed)
Adult Services Patient-Family Contact/Session  Attendees:  Dr Jake Samples, Marya Amsler CSW, patient, pt husband Milbert Coulter, pt brother Al, pt son Glory Rosebush, pt daughter was on speaker phone.  Goal(s):  Family requested meeting to discuss aftercare plan.  CSW spoke to Pt and she agreed.   Safety Concerns:    Narrative:  Dr Jake Samples gave update on pt paranoia as being improved.  Discussed cocaine use/addiction issues. Husband, son, daughter all upset with pt.  Husband emotional, states he has been dealing with his wife's addiction for 5 years, described it as a "burden" and said he has had to hide it from their children and others in their life.  Patient somewhat defensive but did hear them out and acknowledged her addiction.  Pt maintains she does not want residential, wants to attend IOP treatment.  Husband agrees to this but call this her "last chance" and states that if she fails to remain in recovery she will be asked to leave.  Pt brother, Al is from [place] and suggests that pt come stay with he and his wife for a month to allow pt husband some time to decompress.  Pt agreeable to this, entire family thinks this is a good idea. Al needs to discuss with his wife to make sure she is in agreement.    Barrier(s):    Interventions:    Recommendation(s):    Follow-up Required:  Yes  Explanation:  Wait for update from pt brother, locate CD IOP program in [place].  Joanne Chars 03/15/2018, 1:34 PM

## 2018-03-16 MED ORDER — FERROUS SULFATE 325 (65 FE) MG PO TABS: 325.0000 mg | ORAL_TABLET | Freq: Every day | ORAL | 3 refills | Status: AC

## 2018-03-16 MED ORDER — HYDROXYZINE HCL 25 MG PO TABS: 25.0000 mg | ORAL_TABLET | Freq: Three times a day (TID) | ORAL | 0 refills | Status: AC | PRN

## 2018-03-16 MED ORDER — AMANTADINE HCL 100 MG PO CAPS: 100.0000 mg | ORAL_CAPSULE | Freq: Two times a day (BID) | ORAL | 0 refills | Status: AC

## 2018-03-16 MED ORDER — SIMVASTATIN 40 MG PO TABS: 40.0000 mg | ORAL_TABLET | Freq: Every day | ORAL | 0 refills | Status: AC

## 2018-03-16 MED ORDER — ATENOLOL 50 MG PO TABS: 50.0000 mg | ORAL_TABLET | Freq: Every day | ORAL | 0 refills | Status: AC

## 2018-03-16 MED ORDER — RISPERIDONE 0.5 MG PO TABS: 0.5000 mg | ORAL_TABLET | Freq: Every day | ORAL | 0 refills | Status: AC

## 2018-03-16 MED ORDER — VITAMIN D 1000 UNITS PO TABS: 1000.0000 [IU] | ORAL_TABLET | Freq: Every day | ORAL | Status: AC

## 2018-03-16 MED ORDER — HYDROCHLOROTHIAZIDE 25 MG PO TABS: 25.0000 mg | ORAL_TABLET | Freq: Every day | ORAL | 0 refills | Status: AC

## 2018-03-16 MED ORDER — ADULT MULTIVITAMIN W/MINERALS CH: 1.0000 | ORAL_TABLET | Freq: Every day | ORAL | Status: AC

## 2018-03-16 NOTE — [facility] Suicide Risk Assessment (Signed)
[facility] Discharge Suicide Risk Assessment   Principal Problem: Cocaine induced psychosis/cocaine dependence  Discharge Diagnoses: Active Problems:   Severe major depression with psychotic features, mood-congruent ([facility])  Currently alert oriented without thoughts of harming self or others no cravings tremors or withdrawal no psychosis no paranoia  Total Time spent with patient: 45 min Mental Status Per Nursing Assessment::   On Admission:  NA  Demographic Factors:  NA  Loss Factors: NA  Historical Factors: NA  Risk Reduction Factors:   Sense of responsibility to family and Religious beliefs about death  Continued Clinical Symptoms:  Alcohol/Substance Abuse/Dependencies  Cognitive Features That Contribute To Risk:  None    Suicide Risk:  Minimal: No identifiable suicidal ideation.  Patients presenting with no risk factors but with morbid ruminations; may be classified as minimal risk based on the severity of the depressive symptoms    Plan Of Care/Follow-up recommendations:  Activity:  full  Jody Pullara, MD 03/16/2018, 8:08 AM

## 2018-03-16 NOTE — Discharge Summary (Signed)
Physician Discharge Summary Note  Patient:  Jody Simmons is an 63 y.o., female  MRN:  [id number]  DOB:  Sep 09, 1955  Patient phone:  [phone] (home)  Patient address:   [address] [place] [address],   Total Time spent with patient: Greater than 30 minutes  Date of Admission:  03/12/2018  Date of Discharge: 03-16-2018  Reason for Admission: Worsening paranoia.  Principal Problem: Severe major depression with psychotic features, mood-congruent [facility])  Discharge Diagnoses: Principal Problem:   Severe major depression with psychotic features, mood-congruent ([place])  Past Psychiatric History: (Per Md's admission evaluation): Patient is a 63 year old female with a past psychiatric history significant for cocaine dependence who presented to the [facility] emergency department on 03/12/2018 after her children brought her in for psychiatric assessment.The patient stated that her children were concerned about her recent behavior. The patient has a family history of dementia and her children were worried that she was developing dementia. Patient stated that she has been using cocaine for several years. She stated that she has been using cocaine on a semi-regular basis over the last 7 years. She stated that she had quit using before, and that her husband found out she had used. That it caused conflict at least according to the patient. She stated that he had been very paranoid about her. She stated that he had placed a tracking device on her car that she had found, and that she felt as though he was putting substances in her drinks and food because the way that they tasted. She also admitted that there were motion detectors in the home that monitor her movement. She has a history of hypertension, but denied any previous strokes or heart attacks. She stated that she had not used cocaine in a month, however her drug screen was positive for cocaine and benzodiazepines  (she was prescribed clonazepam). She stated that she drank alcohol but they were only "the [place] cans". He denied any other substances. She is retired from working at [employer]. She denied any family history of thought disorders. She denied any previous psychiatric admissions or treatment. She was admitted to the hospital for evaluation and stabilization.  After the above admission assessment, Jody Simmons was started on the medication regimen for her presenting symptoms. She received, stabilized & was discharged on the medications as listed below. She was enrolled & participated in the group counseling sessions being offered & held on this unit. She learned coping skills. She was also resumed/discharged on the other medication regimen for the other medical issues presented. She tolerated her treatment regimen without any adverse effects or reactions reported.   Jody Simmons is seen today by the attending psychiatrist for discharge evaluation. She says she is doing well. She is no longer feeling paranoid. She is looking forward to working on her mental health/substance abuse issues from here onwards. Not expressing any delusions today. No hallucinations. Feels in control of herself. No passivity of thought or will. No fantasy about suicide lately. No suicidal thoughts. Looking forward to getting back to her life. No thoughts of violence. No craving for substances. Does not feel depressed. No evidence of mania.  The nursing staff reports that patient has been appropriate on the unit. Patient has been interacting well with peers. No behavioral issues. Patient has not voiced any suicidal thoughts. Patient has not been observed to be internally stimulated or preoccupied. Patient has been adherent with treatment recommendations. Patient has been tolerating her medications well. No reported adverse  effects or reactions.   Patient was discussed at the treatment team meeting this morning. The team members feel that  patient is back to her baseline level of function. Team agrees with plan to discharge patient today to continue mental health health care on an outpatient basis as noted below. She was provided with all the pertinent information needed to make this appointment without problems. She was able to engage in safety planning including plan to return to [facility] or contact emergency services if she feels unable to maintain [place]rown safety or the safety of others. Pt had no further questions, comments or concerns. she left [facility] with all personal belongings in no apparent distress.    Past Medical History:  Past Medical History:  Diagnosis Date  . Anemia   . Carcinoid tumor    rectum  . Hyperlipidemia   . Hypertension     Past Surgical History:  Procedure Laterality Date  . fibroidectomy    . NASAL SEPTUM SURGERY     Family History:  Family History  Problem Relation Age of Onset  . Hypertension Mother   . Hypertension Father   . Breast cancer Maternal Grandmother    Family Psychiatric  History: See H&P.  Social History:  Social History   Substance and Sexual Activity  Alcohol Use Not Currently     Social History   Substance and Sexual Activity  Drug Use Yes  . Types: Cocaine    Social History   Socioeconomic History  . Marital status: Married    Spouse name: Not on file  . Number of children: Not on file  . Years of education: Not on file  . Highest education level: Not on file  Occupational History  . Not on file  Social Needs  . Financial resource strain: Not on file  . Food insecurity:    Worry: Not on file    Inability: Not on file  . Transportation needs:    Medical: Not on file    Non-medical: Not on file  Tobacco Use  . Smoking status: Never Smoker  . Smokeless tobacco: Never Used  Substance and Sexual Activity  . Alcohol use: Not Currently  . Drug use: Yes    Types: Cocaine  . Sexual activity: Not on file  Lifestyle  . Physical activity:    Days per week: Not  on file    Minutes per session: Not on file  . Stress: Not on file  Relationships  . Social connections:    Talks on phone: Not on file    Gets together: Not on file    Attends religious service: Not on file    Active member of club or organization: Not on file    Attends meetings of clubs or organizations: Not on file    Relationship status: Not on file  Other Topics Concern  . Not on file  Social History Narrative  . Not on file   Hospital Course: (Per Md's admission evaluation):   Physical Findings: AIMS: Facial and Oral Movements Muscles of Facial Expression: None, normal Lips and Perioral Area: None, normal Jaw: None, normal Tongue: None, normal,Extremity Movements Upper (arms, wrists, hands, fingers): None, normal Lower (legs, knees, ankles, toes): None, normal, Trunk Movements Neck, shoulders, hips: None, normal, Overall Severity Severity of abnormal movements (highest score from questions above): None, normal Incapacitation due to abnormal movements: None, normal Patient's awareness of abnormal movements (rate only patient's report): No Awareness, Dental Status Current problems with teeth and/or dentures?: No  Does patient usually wear dentures?: No  CIWA:  CIWA-Ar Total: 2 COWS:  COWS Total Score: 0  Musculoskeletal: Strength & Muscle Tone: within normal limits Gait & Station: normal Patient leans: N/A  Psychiatric Specialty Exam: Physical Exam  Nursing note and vitals reviewed. Constitutional: She appears well-developed.  HENT:  Head: Normocephalic.  Eyes: Pupils are equal, round, and reactive to light.  Neck: Normal range of motion.  Cardiovascular: Normal rate.  Respiratory: Effort normal.  GI: Soft.  Genitourinary:    Genitourinary Comments: Deferred   Musculoskeletal: Normal range of motion.  Neurological: She is alert.  Skin: Skin is warm.    Review of Systems  Constitutional: Negative.   HENT: Negative.   Eyes: Negative.   Respiratory:  Negative.  Negative for cough and shortness of breath.   Cardiovascular: Negative.  Negative for chest pain, palpitations and orthopnea.  Gastrointestinal: Negative.  Negative for abdominal pain, heartburn, nausea and vomiting.  Genitourinary: Negative.   Musculoskeletal: Negative.   Skin: Negative.   Neurological: Negative.  Negative for dizziness and headaches.  Endo/Heme/Allergies: Negative.   Psychiatric/Behavioral: Positive for depression (Stable), hallucinations (Hx. Psychosis (stabilized with medication prior to discharge)) and substance abuse (Hx. Benzodiazepine & THC use disorders). Negative for memory loss and suicidal ideas. The patient has insomnia (Stabilized with medication prior to discharge). The patient is not nervous/anxious (Stable).     Blood pressure 125/71, pulse 75, temperature 98 F (36.7 C), temperature source Oral, resp. rate 18, height 4\' 11"  (1.499 m), weight 67.6 kg.Body mass index is 30.09 kg/m.  See Md's discharge SRA   Have you used any form of tobacco in the last 30 days? (Cigarettes, Smokeless Tobacco, Cigars, and/or Pipes): No  Has this patient used any form of tobacco in the last 30 days? (Cigarettes, Smokeless Tobacco, Cigars, and/or Pipes): N/A  Blood Alcohol level:  Lab Results  Component Value Date   ETH <10 67/54/4920   Metabolic Disorder Labs:  No results found for: HGBA1C, MPG No results found for: PROLACTIN No results found for: CHOL, TRIG, HDL, CHOLHDL, VLDL, LDLCALC  See Psychiatric Specialty Exam and Suicide Risk Assessment completed by Attending Physician prior to discharge.  Discharge destination:  Home  Is patient on multiple antipsychotic therapies at discharge:  No   Has Patient had three or more failed trials of antipsychotic monotherapy by history:  No  Recommended Plan for Multiple Antipsychotic Therapies: NA  Allergies as of 03/16/2018   No Known Allergies     Medication List    STOP taking these medications    clonazePAM 1 MG tablet Commonly known as:  KLONOPIN     TAKE these medications     Indication  amantadine 100 MG capsule Commonly known as:  SYMMETREL Take 1 capsule (100 mg total) by mouth 2 (two) times daily. For prevention of drug induced tremors  Indication:  Tiredness associated with Multiple Sclerosis   atenolol 50 MG tablet Commonly known as:  TENORMIN Take 1 tablet (50 mg total) by mouth daily. For high blood pressure What changed:  additional instructions  Indication:  High Blood Pressure Disorder   cholecalciferol 1000 units tablet Commonly known as:  VITAMIN D Take 1 tablet (1,000 Units total) by mouth daily. (May buy from over the counter): For bone health What changed:  additional instructions  Indication:  Bone heart   ferrous sulfate 325 (65 FE) MG tablet Take 1 tablet (325 mg total) by mouth daily with breakfast. (may buy from over the counter): For anemia  What changed:  additional instructions  Indication:  Anemia From Inadequate Iron in the Body   hydrochlorothiazide 25 MG tablet Commonly known as:  HYDRODIURIL Take 1 tablet (25 mg total) by mouth daily. For high blood pressure Start taking on:  March 17, 2018 What changed:  additional instructions  Indication:  High Blood Pressure Disorder   hydrOXYzine 25 MG tablet Commonly known as:  ATARAX/VISTARIL Take 1 tablet (25 mg total) by mouth 3 (three) times daily as needed for anxiety.  Indication:  Feeling Anxious   multivitamin with minerals Tabs tablet Take 1 tablet by mouth daily. (May buy from over the counter): Vitamin supplement Start taking on:  March 17, 2018 What changed:    how much to take  when to take this  additional instructions  Indication:  Vitamin supplement   risperiDONE 0.5 MG tablet Commonly known as:  RISPERDAL Take 1 tablet (0.5 mg total) by mouth at bedtime. For mood control  Indication:  Mood control   simvastatin 40 MG tablet Commonly known as:  ZOCOR Take 1  tablet (40 mg total) by mouth daily. 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 Go in 3 day(s).   Why:  Please attend an intake assessment for the Chemical Dependency Intensive Outpatient program. Walk in hours are every day, 24/7.  Program hours are Monday-Friday 10am-1pm. Contact information: [address] [place] [address] P: [phone] F: [phone]          Follow-up recommendations: Activity:  As tolerated Diet: As recommended by your primary care doctor. Keep all scheduled follow-up appointments as recommended.    Comments: Patient is instructed prior to discharge to: Take all medications as prescribed by his/her mental healthcare provider. Report any adverse effects and or reactions from the medicines to his/her outpatient provider promptly. Patient has been instructed & cautioned: To not engage in alcohol and or illegal drug use while on prescription medicines. In the event of worsening symptoms, patient is instructed to call the crisis hotline, 911 and or go to the nearest ED for appropriate evaluation and treatment of symptoms. To follow-up with his/her primary care provider for your other medical issues, concerns and or health care needs.  Signed: Lindell Spar, NP, PMHNP, FNP-BC 03/16/2018, 2:12 PM

## 2018-03-16 NOTE — Progress Notes (Signed)
  [facility] Adult Case Management Discharge Plan :  Will you be returning to the same living situation after discharge:  No. Pt will be staying with her brother in [place]. At discharge, do you have transportation home?: Yes,  with brother Do you have the ability to pay for your medications: Yes,  BCBS  Release of information consent forms completed and in the chart;  Patient's signature needed at discharge.  Patient to Follow up at: Follow-up [place]. Go in 3 day(s).   Why:  Please attend an intake assessment for the Chemical Dependency Intensive Outpatient program. Walk in hours are every day, 24/7.  Program hours are Monday-Friday 10am-1pm. Contact information: [address] [place] [address] P: [phone] F: [phone]          Next level of care provider has access to [place] and Suicide Prevention discussed: Yes,  with husband  Have you used any form of tobacco in the last 30 days? (Cigarettes, Smokeless Tobacco, Cigars, and/or Pipes): No  Has patient been referred to the Quitline?: N/A patient is not a smoker  Patient has been referred for addiction treatment: Yes  Joanne Chars, [place] 03/16/2018, 10:00 AM

## 2018-03-16 NOTE — Plan of Care (Signed)
Pt was able to identify positive coping skills at completion of recreation therapy group sessions.   Donyetta Ogletree, LRT/CTRS  

## 2018-03-16 NOTE — Progress Notes (Signed)
Recreation Therapy Notes  INPATIENT RECREATION TR PLAN  Patient Details Name: GARNETTA FEDRICK MRN: [id number] DOB: [birth year] Today's Date: 03/16/2018  Rec Therapy Plan Is patient appropriate for Therapeutic Recreation?: Yes Treatment times per week: about 3 days Estimated Length of Stay: 5-7 days TR Treatment/Interventions: Group participation (Comment)  Discharge Criteria Pt will be discharged from therapy if:: Discharged Treatment plan/goals/alternatives discussed and agreed upon by:: Patient/family  Discharge Summary Short term goals set: See patient care plan Short term goals met: Complete Progress toward goals comments: Groups attended Which groups?: Wellness, Coping skills, Other (Comment)(Anxiety) Reason goals not met: None Therapeutic equipment acquired: N/A Reason patient discharged from therapy: Discharge from hospital Pt/family agrees with progress & goals achieved: Yes Date patient discharged from therapy: 03/16/18    Victorino Sparrow, LRT/CTRS  Ria Comment, Brendalee Matthies A 03/16/2018, 11:17 AM

## 2018-03-16 NOTE — [facility] Group Notes (Signed)
[place] LCSW Group Therapy Note  Date/Time: 03/15/18, 1100  Type of Therapy/Topic:  Group Therapy:  Feelings about Diagnosis  Participation Level:  Active   Mood:pleasant   Description of Group:    This group will allow patients to explore their thoughts and feelings about diagnoses they have received. Patients will be guided to explore their level of understanding and acceptance of these diagnoses. Facilitator will encourage patients to process their thoughts and feelings about the reactions of others to their diagnosis, and will guide patients in identifying ways to discuss their diagnosis with significant others in their lives. This group will be process-oriented, with patients participating in exploration of their own experiences as well as giving and receiving support and challenge from other group members.   Therapeutic Goals: 1. Patient will demonstrate understanding of diagnosis as evidence by identifying two or more symptoms of the disorder:  2. Patient will be able to express two feelings regarding the diagnosis 3. Patient will demonstrate ability to communicate their needs through discussion and/or role plays  Summary of Patient Progress:Pt engaged and active during group, did not share her diagnosis but did speak up and make several comments.  Pt shared previously that she was uncomfortable sharing about her addiction issues with strangers.          Therapeutic Modalities:   Cognitive Behavioral Therapy Brief Therapy Feelings Identification   Lurline Idol, LCSW

## 2018-03-16 NOTE — Progress Notes (Signed)
Recreation Therapy Notes  Date: 2.5.20 Time: 1000 Location: [address] Dayroom  Group Topic: Coping Skills  Goal Area(s) Addresses:  Pt will be able to identify consequences of using unhealthy coping skills. Pt will be able to identify healthy coping skills. Pt will be able to identify outcomes of using health coping skills.  Behavioral Response:  Engaged  Intervention:  Worksheets, pencils  Activity: Healthy vs. Unhealthy Coping Strategies.  Patients were to read up on the difference between healthy and unhealthy coping skills.  Patients were to then identify the problem they are currently facing.  Patient then identified the negative coping skills they use and they consequences of those coping skills.  Patients would then identify healthy coping skills they could use, benefits and barriers that would prevent them from using the positive coping skills.  Education: Radiographer, therapeutic, Dentist.   Education Outcome: Acknowledges understanding/In group clarification offered/Needs additional education.   Clinical Observations/Feedback: Pt stated her problem was getting her family to trust her again.  Pt identified her unhealthy coping strategies as drug use and the consequences were ending up back here in the hospital.  Pt expressed her healthy coping strategies were getting professional help and joining a support group.  Pt explained the outcomes would be "getting a balance and being around people in similar circumstances".  Pt stated her barriers would be feeling like she doesn't need to go to the groups and not having the money for professional help.   Victorino Sparrow, LRT/CTRS         Ria Comment, Taner Rzepka A 03/16/2018 10:55 AM

## 2018-03-16 NOTE — Progress Notes (Signed)
Pt discharged to lobby. Pt was stable and appreciative at that time. All papers and prescriptions were given and valuables returned. Verbal understanding expressed. Denies SI/HI and A/VH. Pt given opportunity to express concerns and ask questions.  

## 2018-04-05 ENCOUNTER — Other Ambulatory Visit: Payer: Self-pay | Admitting: Family Medicine

## 2018-04-05 DIAGNOSIS — Z1231 Encounter for screening mammogram for malignant neoplasm of breast: Secondary | ICD-10-CM

## 2018-05-09 ENCOUNTER — Ambulatory Visit: Payer: Self-pay

## 2018-06-09 ENCOUNTER — Ambulatory Visit: Payer: Self-pay

## 2018-07-21 ENCOUNTER — Other Ambulatory Visit: Payer: Self-pay

## 2018-07-21 ENCOUNTER — Ambulatory Visit
Admission: RE | Admit: 2018-07-21 | Discharge: 2018-07-21 | Disposition: A | Discharge status: Home / Self Care | Payer: BC Managed Care – PPO | Source: Ambulatory Visit | Attending: Family Medicine | Admitting: Family Medicine

## 2018-07-21 DIAGNOSIS — Z1231 Encounter for screening mammogram for malignant neoplasm of breast: Secondary | ICD-10-CM

## 2019-06-15 ENCOUNTER — Other Ambulatory Visit: Payer: Self-pay | Admitting: Family Medicine

## 2019-06-15 DIAGNOSIS — Z1231 Encounter for screening mammogram for malignant neoplasm of breast: Secondary | ICD-10-CM

## 2019-07-12 ENCOUNTER — Ambulatory Visit
Admission: RE | Admit: 2019-07-12 | Discharge: 2019-07-12 | Disposition: A | Discharge status: Home / Self Care | Payer: BC Managed Care – PPO | Source: Ambulatory Visit | Attending: Physician Assistant | Admitting: Physician Assistant

## 2019-07-12 ENCOUNTER — Other Ambulatory Visit: Payer: Self-pay | Admitting: Physician Assistant

## 2019-07-12 DIAGNOSIS — T1490XA Injury, unspecified, initial encounter: Secondary | ICD-10-CM

## 2019-07-25 ENCOUNTER — Ambulatory Visit
Admission: RE | Admit: 2019-07-25 | Discharge: 2019-07-25 | Disposition: A | Discharge status: Home / Self Care | Payer: BC Managed Care – PPO | Source: Ambulatory Visit | Attending: Family Medicine | Admitting: Family Medicine

## 2019-07-25 ENCOUNTER — Other Ambulatory Visit: Payer: Self-pay

## 2019-07-25 DIAGNOSIS — Z1231 Encounter for screening mammogram for malignant neoplasm of breast: Secondary | ICD-10-CM

## 2020-05-17 DIAGNOSIS — F329 Major depressive disorder, single episode, unspecified: Secondary | ICD-10-CM | POA: Diagnosis not present

## 2020-05-28 DIAGNOSIS — E7800 Pure hypercholesterolemia, unspecified: Secondary | ICD-10-CM | POA: Diagnosis not present

## 2020-05-28 DIAGNOSIS — K5900 Constipation, unspecified: Secondary | ICD-10-CM | POA: Diagnosis not present

## 2020-05-28 DIAGNOSIS — I10 Essential (primary) hypertension: Secondary | ICD-10-CM | POA: Diagnosis not present

## 2020-06-14 ENCOUNTER — Other Ambulatory Visit: Payer: Self-pay | Admitting: Family Medicine

## 2020-06-14 DIAGNOSIS — Z1231 Encounter for screening mammogram for malignant neoplasm of breast: Secondary | ICD-10-CM

## 2020-06-18 DIAGNOSIS — F329 Major depressive disorder, single episode, unspecified: Secondary | ICD-10-CM | POA: Diagnosis not present

## 2020-07-23 DIAGNOSIS — I493 Ventricular premature depolarization: Secondary | ICD-10-CM | POA: Diagnosis not present

## 2020-07-23 DIAGNOSIS — H6120 Impacted cerumen, unspecified ear: Secondary | ICD-10-CM | POA: Diagnosis not present

## 2020-07-23 DIAGNOSIS — R002 Palpitations: Secondary | ICD-10-CM | POA: Diagnosis not present

## 2020-07-30 DIAGNOSIS — F329 Major depressive disorder, single episode, unspecified: Secondary | ICD-10-CM | POA: Diagnosis not present

## 2020-08-07 ENCOUNTER — Other Ambulatory Visit: Payer: Self-pay

## 2020-08-07 ENCOUNTER — Ambulatory Visit
Admission: RE | Admit: 2020-08-07 | Discharge: 2020-08-07 | Disposition: A | Discharge status: Home / Self Care | Payer: Medicare PPO | Source: Ambulatory Visit | Attending: Family Medicine | Admitting: Family Medicine

## 2020-08-07 DIAGNOSIS — Z1231 Encounter for screening mammogram for malignant neoplasm of breast: Secondary | ICD-10-CM | POA: Diagnosis not present

## 2020-09-05 DIAGNOSIS — F329 Major depressive disorder, single episode, unspecified: Secondary | ICD-10-CM | POA: Diagnosis not present

## 2020-10-28 DIAGNOSIS — F329 Major depressive disorder, single episode, unspecified: Secondary | ICD-10-CM | POA: Diagnosis not present

## 2020-11-06 DIAGNOSIS — M79604 Pain in right leg: Secondary | ICD-10-CM | POA: Diagnosis not present

## 2020-11-11 DIAGNOSIS — N958 Other specified menopausal and perimenopausal disorders: Secondary | ICD-10-CM | POA: Diagnosis not present

## 2020-11-19 DIAGNOSIS — M79604 Pain in right leg: Secondary | ICD-10-CM | POA: Diagnosis not present

## 2020-11-28 DIAGNOSIS — M79604 Pain in right leg: Secondary | ICD-10-CM | POA: Diagnosis not present

## 2020-12-04 DIAGNOSIS — F39 Unspecified mood [affective] disorder: Secondary | ICD-10-CM | POA: Diagnosis not present

## 2020-12-04 DIAGNOSIS — H9190 Unspecified hearing loss, unspecified ear: Secondary | ICD-10-CM | POA: Diagnosis not present

## 2020-12-04 DIAGNOSIS — I493 Ventricular premature depolarization: Secondary | ICD-10-CM | POA: Diagnosis not present

## 2020-12-04 DIAGNOSIS — Z1389 Encounter for screening for other disorder: Secondary | ICD-10-CM | POA: Diagnosis not present

## 2020-12-04 DIAGNOSIS — I10 Essential (primary) hypertension: Secondary | ICD-10-CM | POA: Diagnosis not present

## 2020-12-04 DIAGNOSIS — Z0000 Encounter for general adult medical examination without abnormal findings: Secondary | ICD-10-CM | POA: Diagnosis not present

## 2020-12-04 DIAGNOSIS — E7800 Pure hypercholesterolemia, unspecified: Secondary | ICD-10-CM | POA: Diagnosis not present

## 2020-12-04 DIAGNOSIS — Z86010 Personal history of colonic polyps: Secondary | ICD-10-CM | POA: Diagnosis not present

## 2020-12-04 DIAGNOSIS — Z23 Encounter for immunization: Secondary | ICD-10-CM | POA: Diagnosis not present

## 2020-12-24 DIAGNOSIS — F329 Major depressive disorder, single episode, unspecified: Secondary | ICD-10-CM | POA: Diagnosis not present

## 2020-12-25 DIAGNOSIS — N952 Postmenopausal atrophic vaginitis: Secondary | ICD-10-CM | POA: Diagnosis not present

## 2020-12-25 DIAGNOSIS — Z01419 Encounter for gynecological examination (general) (routine) without abnormal findings: Secondary | ICD-10-CM | POA: Diagnosis not present

## 2020-12-25 DIAGNOSIS — Z6835 Body mass index (BMI) 35.0-35.9, adult: Secondary | ICD-10-CM | POA: Diagnosis not present

## 2020-12-25 DIAGNOSIS — N760 Acute vaginitis: Secondary | ICD-10-CM | POA: Diagnosis not present

## 2021-02-11 DIAGNOSIS — F329 Major depressive disorder, single episode, unspecified: Secondary | ICD-10-CM | POA: Diagnosis not present

## 2021-03-28 DIAGNOSIS — F329 Major depressive disorder, single episode, unspecified: Secondary | ICD-10-CM | POA: Diagnosis not present

## 2021-05-13 DIAGNOSIS — F329 Major depressive disorder, single episode, unspecified: Secondary | ICD-10-CM | POA: Diagnosis not present

## 2021-06-04 DIAGNOSIS — I10 Essential (primary) hypertension: Secondary | ICD-10-CM | POA: Diagnosis not present

## 2021-06-04 DIAGNOSIS — E7800 Pure hypercholesterolemia, unspecified: Secondary | ICD-10-CM | POA: Diagnosis not present

## 2021-07-03 ENCOUNTER — Other Ambulatory Visit: Payer: Self-pay | Admitting: Family Medicine

## 2021-07-03 DIAGNOSIS — Z1231 Encounter for screening mammogram for malignant neoplasm of breast: Secondary | ICD-10-CM

## 2021-07-15 DIAGNOSIS — F329 Major depressive disorder, single episode, unspecified: Secondary | ICD-10-CM | POA: Diagnosis not present

## 2021-08-19 ENCOUNTER — Ambulatory Visit
Admission: RE | Admit: 2021-08-19 | Discharge: 2021-08-19 | Disposition: A | Discharge status: Home / Self Care | Payer: Medicare PPO | Source: Ambulatory Visit | Attending: Family Medicine | Admitting: Family Medicine

## 2021-08-19 DIAGNOSIS — Z1231 Encounter for screening mammogram for malignant neoplasm of breast: Secondary | ICD-10-CM | POA: Diagnosis not present

## 2021-10-24 DIAGNOSIS — F329 Major depressive disorder, single episode, unspecified: Secondary | ICD-10-CM | POA: Diagnosis not present

## 2021-10-29 DIAGNOSIS — R062 Wheezing: Secondary | ICD-10-CM | POA: Diagnosis not present

## 2021-10-29 DIAGNOSIS — Z23 Encounter for immunization: Secondary | ICD-10-CM | POA: Diagnosis not present

## 2021-12-01 DIAGNOSIS — F329 Major depressive disorder, single episode, unspecified: Secondary | ICD-10-CM | POA: Diagnosis not present

## 2021-12-09 DIAGNOSIS — Z0000 Encounter for general adult medical examination without abnormal findings: Secondary | ICD-10-CM | POA: Diagnosis not present

## 2021-12-09 DIAGNOSIS — I10 Essential (primary) hypertension: Secondary | ICD-10-CM | POA: Diagnosis not present

## 2021-12-09 DIAGNOSIS — I493 Ventricular premature depolarization: Secondary | ICD-10-CM | POA: Diagnosis not present

## 2021-12-09 DIAGNOSIS — E7800 Pure hypercholesterolemia, unspecified: Secondary | ICD-10-CM | POA: Diagnosis not present

## 2021-12-09 DIAGNOSIS — Z86010 Personal history of colonic polyps: Secondary | ICD-10-CM | POA: Diagnosis not present

## 2021-12-09 DIAGNOSIS — D509 Iron deficiency anemia, unspecified: Secondary | ICD-10-CM | POA: Diagnosis not present

## 2021-12-23 DIAGNOSIS — Z6835 Body mass index (BMI) 35.0-35.9, adult: Secondary | ICD-10-CM | POA: Diagnosis not present

## 2021-12-23 DIAGNOSIS — E7800 Pure hypercholesterolemia, unspecified: Secondary | ICD-10-CM | POA: Diagnosis not present

## 2021-12-23 DIAGNOSIS — I10 Essential (primary) hypertension: Secondary | ICD-10-CM | POA: Diagnosis not present

## 2022-01-06 IMAGING — MG MM DIGITAL SCREENING BILAT W/ TOMO AND CAD
6 of 10 series · 6 of 30 positions shown · non-contrast
Comparison: Previous exam(s).

CLINICAL DATA: Screening.

EXAM:
DIGITAL SCREENING BILATERAL MAMMOGRAM WITH TOMOSYNTHESIS AND CAD
TECHNIQUE: Bilateral screening digital craniocaudal and mediolateral oblique
mammograms were obtained. Bilateral screening digital breast
tomosynthesis was performed. The images were evaluated with
computer-aided detection.

[L MLO synth-2D]
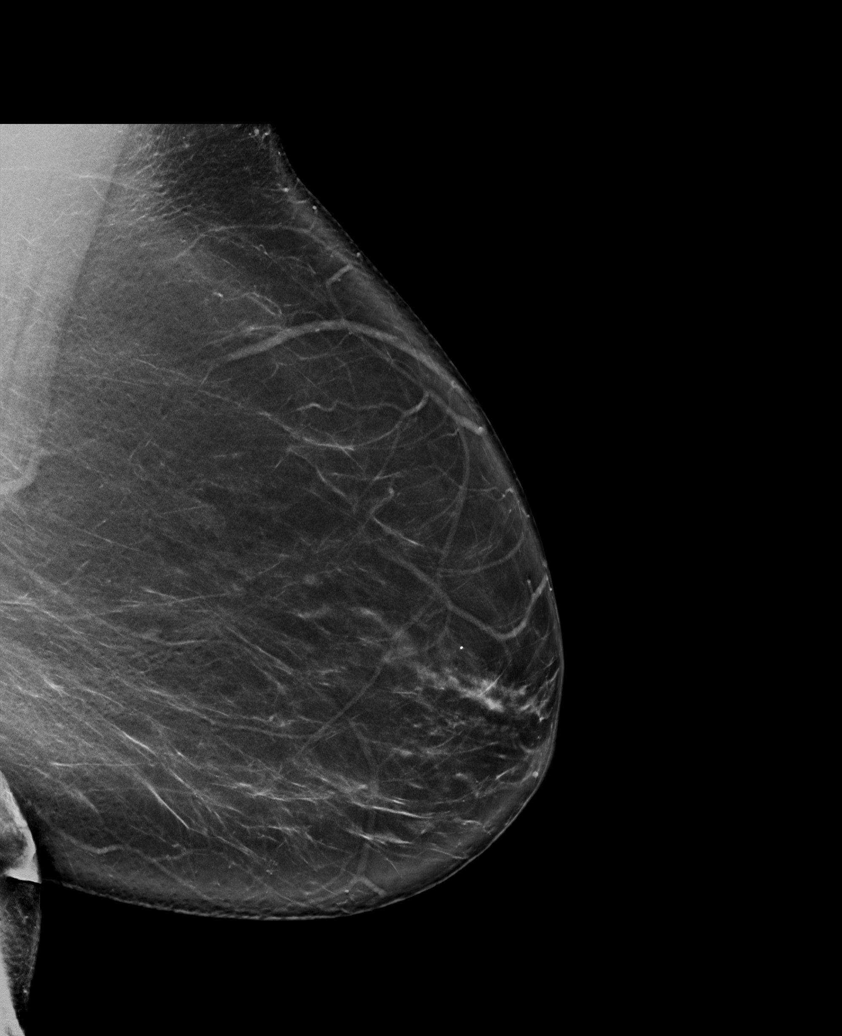

[R MLO synth-2D (1 of 2)]
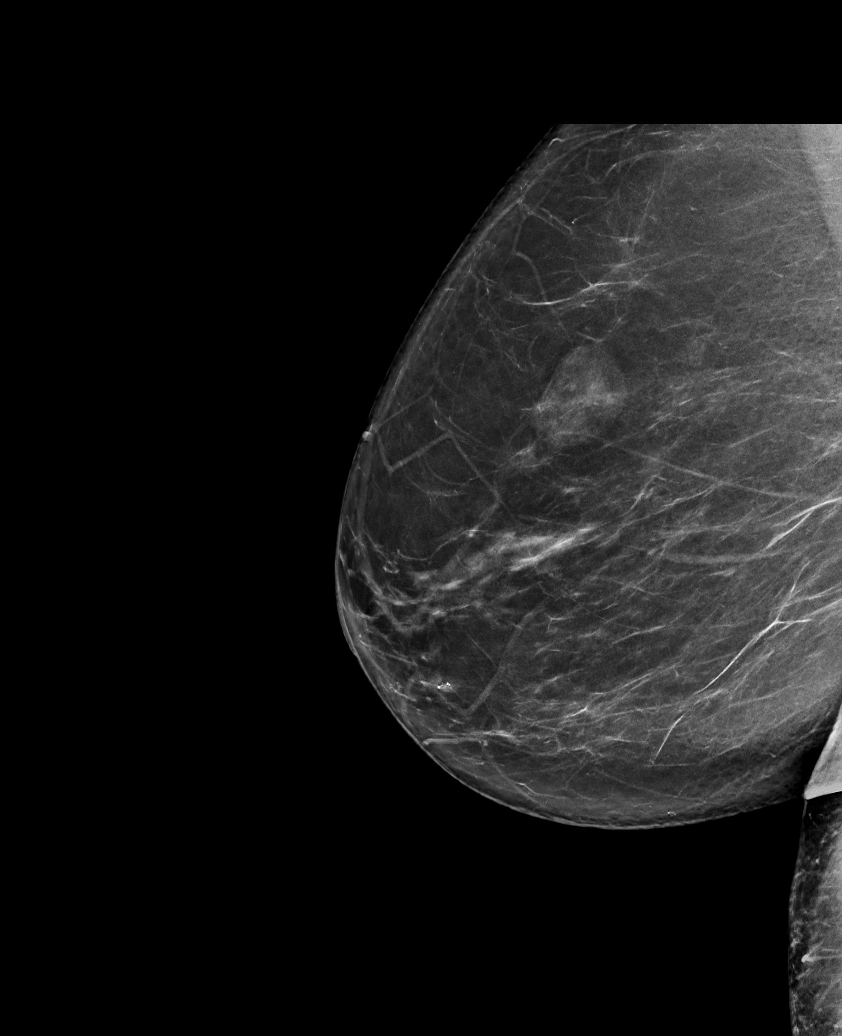

[L CC synth-2D]
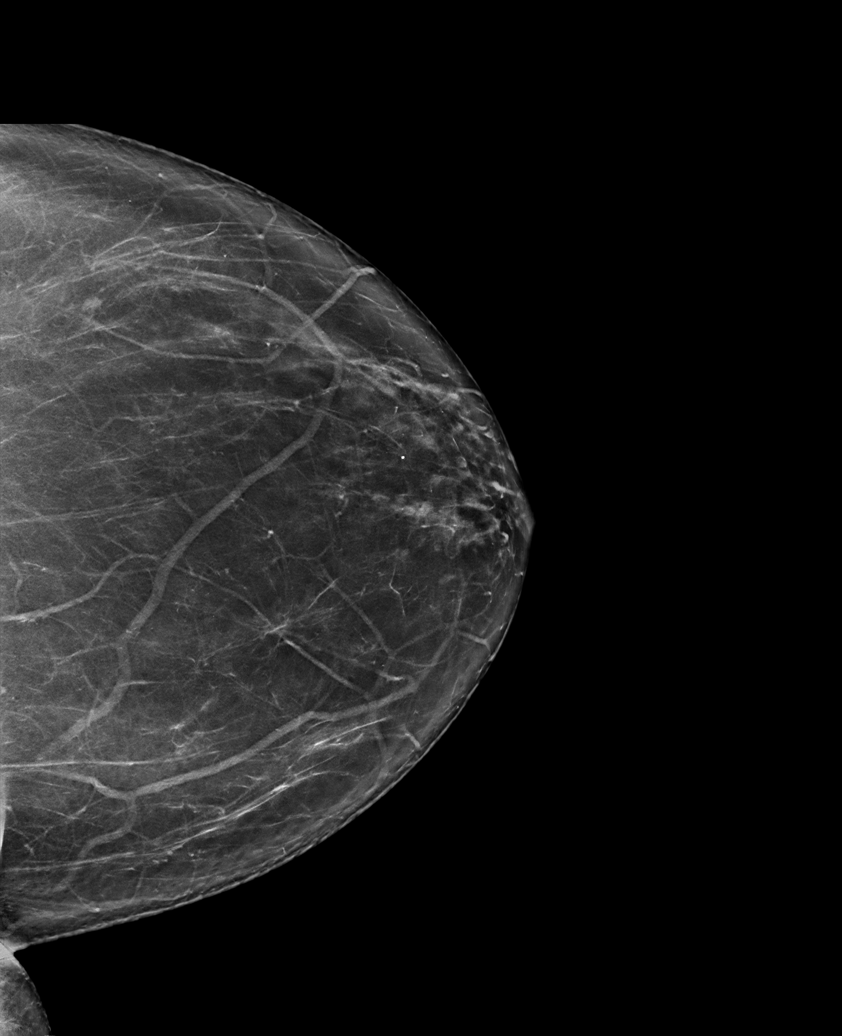

[R MLO synth-2D (2 of 2)]
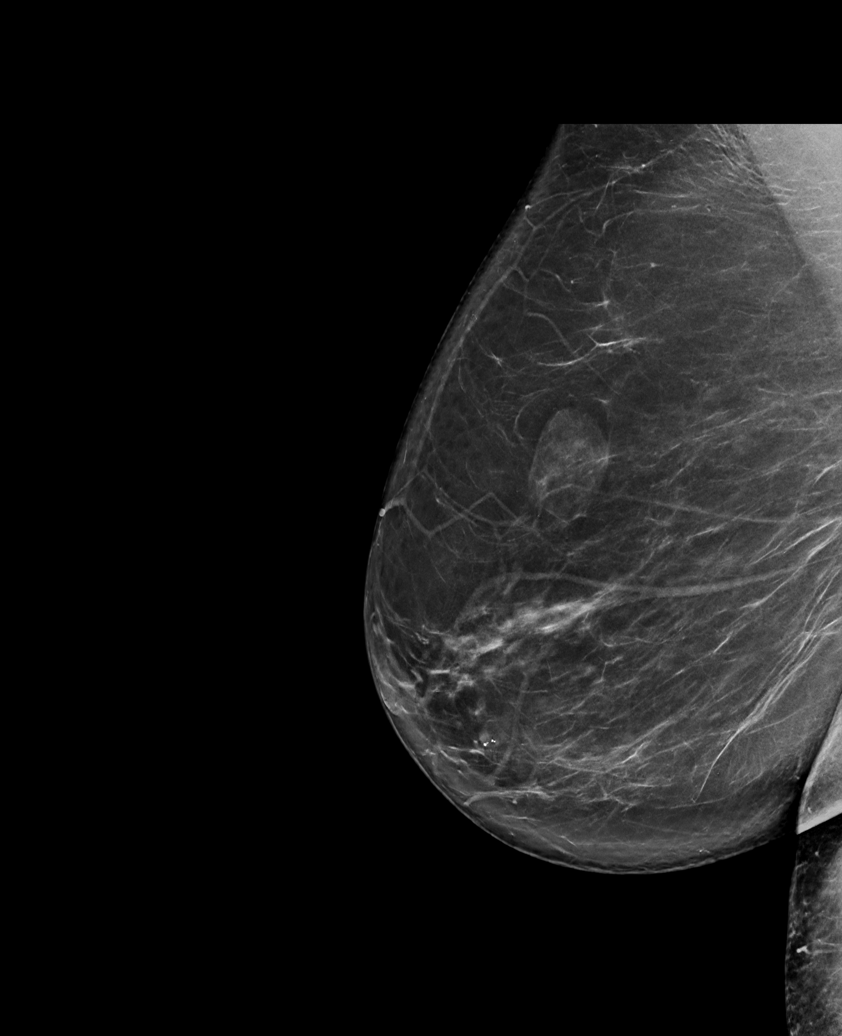

[R CC synth-2D]
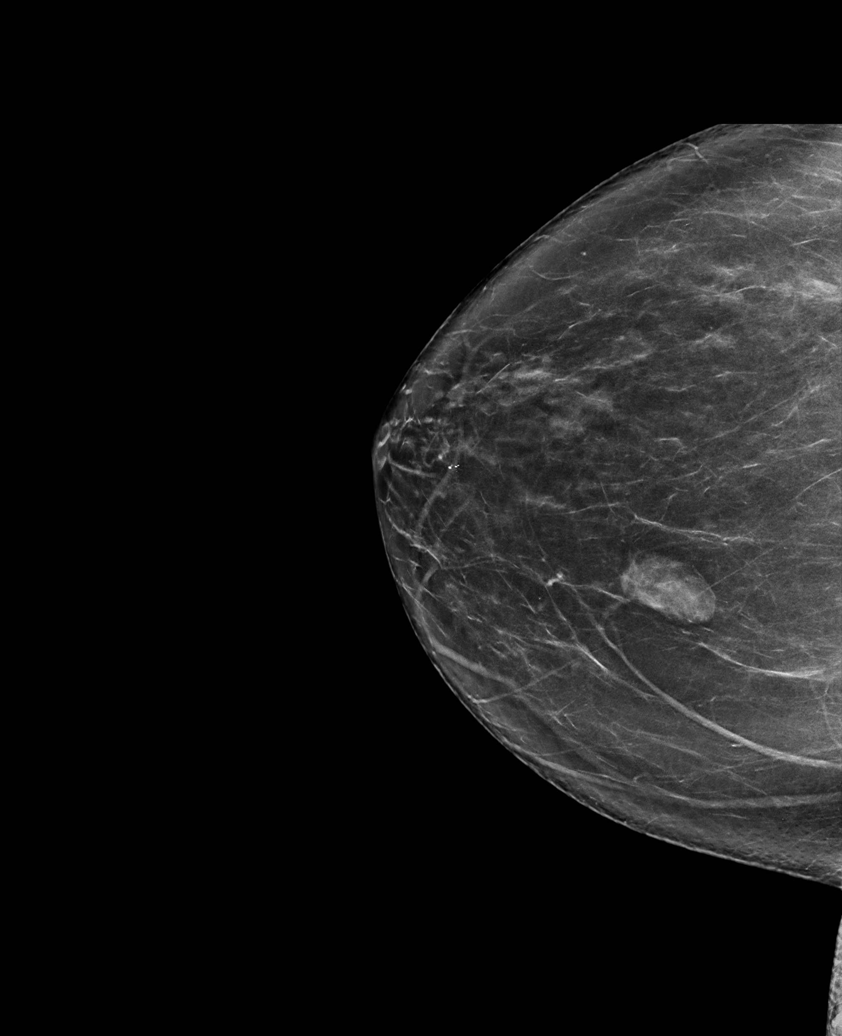

[R MLO tomo · tomo slice 43/86.0]
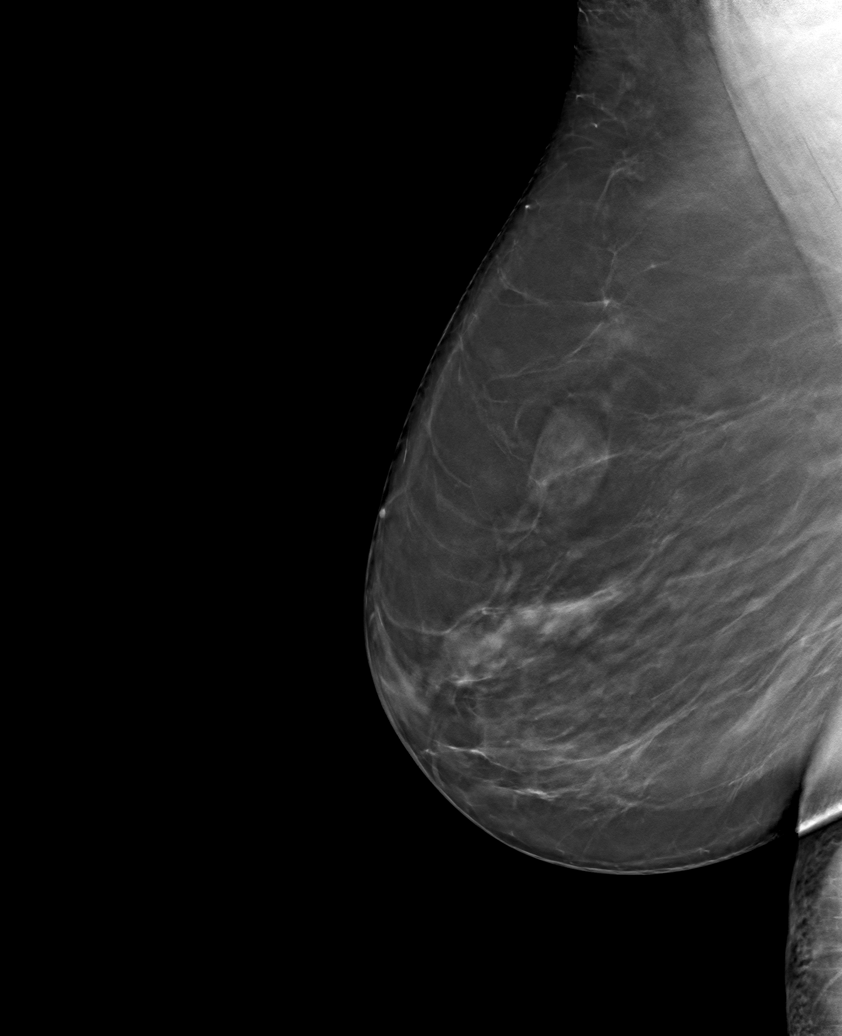

[6 of 30 positions shown; findings below may reference images not displayed]

ACR Breast Density Category b: There are scattered areas of
fibroglandular density.
FINDINGS: There are no findings suspicious for malignancy.
IMPRESSION: No mammographic evidence of malignancy. A result letter of this
screening mammogram will be mailed directly to the patient.

RECOMMENDATION:
Screening mammogram in one year. (Code:51-O-LD2)

BI-RADS CATEGORY  1: Negative.

## 2022-01-27 DIAGNOSIS — F419 Anxiety disorder, unspecified: Secondary | ICD-10-CM | POA: Diagnosis not present

## 2022-05-21 DIAGNOSIS — R519 Headache, unspecified: Secondary | ICD-10-CM | POA: Diagnosis not present

## 2022-06-12 DIAGNOSIS — F419 Anxiety disorder, unspecified: Secondary | ICD-10-CM | POA: Diagnosis not present

## 2022-06-17 DIAGNOSIS — H00016 Hordeolum externum left eye, unspecified eyelid: Secondary | ICD-10-CM | POA: Diagnosis not present

## 2022-06-17 DIAGNOSIS — E7800 Pure hypercholesterolemia, unspecified: Secondary | ICD-10-CM | POA: Diagnosis not present

## 2022-06-17 DIAGNOSIS — D509 Iron deficiency anemia, unspecified: Secondary | ICD-10-CM | POA: Diagnosis not present

## 2022-06-17 DIAGNOSIS — I10 Essential (primary) hypertension: Secondary | ICD-10-CM | POA: Diagnosis not present

## 2022-06-17 DIAGNOSIS — I493 Ventricular premature depolarization: Secondary | ICD-10-CM | POA: Diagnosis not present

## 2022-06-17 DIAGNOSIS — K5900 Constipation, unspecified: Secondary | ICD-10-CM | POA: Diagnosis not present

## 2022-07-07 ENCOUNTER — Other Ambulatory Visit: Payer: Self-pay | Admitting: Family Medicine

## 2022-07-07 DIAGNOSIS — Z0000 Encounter for general adult medical examination without abnormal findings: Secondary | ICD-10-CM

## 2022-08-04 DIAGNOSIS — E7800 Pure hypercholesterolemia, unspecified: Secondary | ICD-10-CM | POA: Diagnosis not present

## 2022-08-19 DIAGNOSIS — F419 Anxiety disorder, unspecified: Secondary | ICD-10-CM | POA: Diagnosis not present

## 2022-09-01 ENCOUNTER — Ambulatory Visit
Admission: RE | Admit: 2022-09-01 | Discharge: 2022-09-01 | Disposition: A | Discharge status: Home / Self Care | Payer: Medicare PPO | Source: Ambulatory Visit | Attending: Family Medicine | Admitting: Family Medicine

## 2022-09-01 DIAGNOSIS — Z0000 Encounter for general adult medical examination without abnormal findings: Secondary | ICD-10-CM

## 2022-09-01 DIAGNOSIS — Z1231 Encounter for screening mammogram for malignant neoplasm of breast: Secondary | ICD-10-CM | POA: Diagnosis not present

## 2022-09-04 DIAGNOSIS — E7800 Pure hypercholesterolemia, unspecified: Secondary | ICD-10-CM | POA: Diagnosis not present

## 2022-10-16 DIAGNOSIS — E7800 Pure hypercholesterolemia, unspecified: Secondary | ICD-10-CM | POA: Diagnosis not present

## 2022-11-13 DIAGNOSIS — H6123 Impacted cerumen, bilateral: Secondary | ICD-10-CM | POA: Diagnosis not present

## 2022-11-13 DIAGNOSIS — H60509 Unspecified acute noninfective otitis externa, unspecified ear: Secondary | ICD-10-CM | POA: Diagnosis not present

## 2022-11-20 DIAGNOSIS — F419 Anxiety disorder, unspecified: Secondary | ICD-10-CM | POA: Diagnosis not present

## 2022-12-22 DIAGNOSIS — I10 Essential (primary) hypertension: Secondary | ICD-10-CM | POA: Diagnosis not present

## 2022-12-22 DIAGNOSIS — R7309 Other abnormal glucose: Secondary | ICD-10-CM | POA: Diagnosis not present

## 2022-12-22 DIAGNOSIS — D509 Iron deficiency anemia, unspecified: Secondary | ICD-10-CM | POA: Diagnosis not present

## 2022-12-22 DIAGNOSIS — Z0000 Encounter for general adult medical examination without abnormal findings: Secondary | ICD-10-CM | POA: Diagnosis not present

## 2022-12-22 DIAGNOSIS — E7800 Pure hypercholesterolemia, unspecified: Secondary | ICD-10-CM | POA: Diagnosis not present

## 2022-12-22 DIAGNOSIS — I493 Ventricular premature depolarization: Secondary | ICD-10-CM | POA: Diagnosis not present

## 2023-01-26 DIAGNOSIS — E7800 Pure hypercholesterolemia, unspecified: Secondary | ICD-10-CM | POA: Diagnosis not present

## 2023-01-26 DIAGNOSIS — F321 Major depressive disorder, single episode, moderate: Secondary | ICD-10-CM | POA: Diagnosis not present

## 2023-03-16 DIAGNOSIS — E119 Type 2 diabetes mellitus without complications: Secondary | ICD-10-CM | POA: Diagnosis not present

## 2023-03-25 DIAGNOSIS — Z1151 Encounter for screening for human papillomavirus (HPV): Secondary | ICD-10-CM | POA: Diagnosis not present

## 2023-03-25 DIAGNOSIS — Z6834 Body mass index (BMI) 34.0-34.9, adult: Secondary | ICD-10-CM | POA: Diagnosis not present

## 2023-03-25 DIAGNOSIS — Z124 Encounter for screening for malignant neoplasm of cervix: Secondary | ICD-10-CM | POA: Diagnosis not present

## 2023-04-15 DIAGNOSIS — I10 Essential (primary) hypertension: Secondary | ICD-10-CM | POA: Diagnosis not present

## 2023-05-10 DIAGNOSIS — E119 Type 2 diabetes mellitus without complications: Secondary | ICD-10-CM | POA: Diagnosis not present

## 2023-05-10 DIAGNOSIS — H40033 Anatomical narrow angle, bilateral: Secondary | ICD-10-CM | POA: Diagnosis not present

## 2023-05-22 ENCOUNTER — Other Ambulatory Visit: Payer: Self-pay

## 2023-05-22 ENCOUNTER — Emergency Department (HOSPITAL_BASED_OUTPATIENT_CLINIC_OR_DEPARTMENT_OTHER)

## 2023-05-22 ENCOUNTER — Encounter (HOSPITAL_BASED_OUTPATIENT_CLINIC_OR_DEPARTMENT_OTHER): Payer: Self-pay | Admitting: Emergency Medicine

## 2023-05-22 ENCOUNTER — Emergency Department (HOSPITAL_BASED_OUTPATIENT_CLINIC_OR_DEPARTMENT_OTHER)
Admission: EM | Admit: 2023-05-22 | Discharge: 2023-05-22 | Disposition: A | Discharge status: Home / Self Care | Attending: Emergency Medicine | Admitting: Emergency Medicine

## 2023-05-22 DIAGNOSIS — R42 Dizziness and giddiness: Secondary | ICD-10-CM | POA: Insufficient documentation

## 2023-05-22 DIAGNOSIS — Z7984 Long term (current) use of oral hypoglycemic drugs: Secondary | ICD-10-CM | POA: Insufficient documentation

## 2023-05-22 DIAGNOSIS — R29818 Other symptoms and signs involving the nervous system: Secondary | ICD-10-CM | POA: Diagnosis not present

## 2023-05-22 DIAGNOSIS — E119 Type 2 diabetes mellitus without complications: Secondary | ICD-10-CM | POA: Diagnosis not present

## 2023-05-22 HISTORY — DX: Type 2 diabetes mellitus without complications: E11.9

## 2023-05-22 LAB — CBC
HCT: 42.1 % (ref 36.0–46.0)
Hemoglobin: 12.9 g/dL (ref 12.0–15.0)
MCH: 23.1 pg — ABNORMAL LOW (ref 26.0–34.0)
MCHC: 30.6 g/dL (ref 30.0–36.0)
MCV: 75.4 fL — ABNORMAL LOW (ref 80.0–100.0)
Platelets: 223 10*3/uL (ref 150–400)
RBC: 5.58 MIL/uL — ABNORMAL HIGH (ref 3.87–5.11)
RDW: 15.4 % (ref 11.5–15.5)
WBC: 5.3 10*3/uL (ref 4.0–10.5)
nRBC: 0 % (ref 0.0–0.2)

## 2023-05-22 LAB — CBG MONITORING, ED: Glucose-Capillary: 106 mg/dL — ABNORMAL HIGH (ref 70–99)

## 2023-05-22 LAB — BASIC METABOLIC PANEL WITH GFR
Anion gap: 9 (ref 5–15)
BUN: 20 mg/dL (ref 8–23)
CO2: 27 mmol/L (ref 22–32)
Calcium: 10.2 mg/dL (ref 8.9–10.3)
Chloride: 101 mmol/L (ref 98–111)
Creatinine, Ser: 0.78 mg/dL (ref 0.44–1.00)
GFR, Estimated: >60 mL/min (ref >60)
Glucose, Bld: 98 mg/dL (ref 70–99)
Potassium: 4 mmol/L (ref 3.5–5.1)
Sodium: 137 mmol/L (ref 135–145)

## 2023-05-22 MED ORDER — MECLIZINE HCL 12.5 MG PO TABS: 12.5000 mg | ORAL_TABLET | Freq: Three times a day (TID) | ORAL | 0 refills | Status: AC | PRN

## 2023-05-22 NOTE — Discharge Instructions (Signed)
 Please return if you are having any new or worsening symptoms Recheck with your doctor this week

## 2023-05-22 NOTE — ED Triage Notes (Signed)
 Dizziness for intermittent for few weeks. Today was more dizzy and hasn't stopped. No fevers. No syncope.no flu like symptoms. No new medications.

## 2023-05-22 NOTE — ED Triage Notes (Signed)
 Pt is new diabetic (2 months ), does not checkher cbg or on any medication.

## 2023-05-22 NOTE — ED Provider Notes (Signed)
 [facility] AT [facility] Provider Note   CSN: [id number] Arrival date & time: 05/22/23  1236     History  No chief complaint on file.   Jody Simmons is a 68 y.o. female.  HPI   68 year old female presents today complaining of dizziness for several weeks.  She reports symptoms are worse with turning her head.  She has had symptoms that have come on today and have not resolved as they have previously.  It is worse with turning her head in certain directions.  She denies any ear pain, nasal congestion, weakness on one side or the other.  She has recently been diagnosed with diabetes and is taking metformin.  She denies any history of A-fib, a flutter, or anticoagulation.  She is not having any headache  Home Medications Prior to Admission medications   Medication Sig Start Date End Date Taking? Authorizing Provider  meclizine (ANTIVERT) 12.5 MG tablet Take 1 tablet (12.5 mg total) by mouth 3 (three) times daily as needed for dizziness. 05/22/23  Yes Auston Blush, MD  amantadine (SYMMETREL) 100 MG capsule Take 1 capsule (100 mg total) by mouth 2 (two) times daily. For prevention of drug induced tremors 03/16/18   Asuncion Layer I, NP  atenolol (TENORMIN) 50 MG tablet Take 1 tablet (50 mg total) by mouth daily. For high blood pressure 03/16/18   Asuncion Layer I, NP  cholecalciferol (VITAMIN D) 1000 units tablet Take 1 tablet (1,000 Units total) by mouth daily. (May buy from over the counter): For bone health 03/16/18   Asuncion Layer I, NP  ferrous sulfate 325 (65 FE) MG tablet Take 1 tablet (325 mg total) by mouth daily with breakfast. (may buy from over the counter): For anemia 03/16/18   Asuncion Layer I, NP  hydrochlorothiazide (HYDRODIURIL) 25 MG tablet Take 1 tablet (25 mg total) by mouth daily. For high blood pressure 03/17/18   Asuncion Layer I, NP  hydrOXYzine (ATARAX/VISTARIL) 25 MG tablet Take 1 tablet (25 mg total) by mouth 3 (three) times daily as needed for anxiety.  03/16/18   Asuncion Layer I, NP  Multiple Vitamin (MULTIVITAMIN WITH MINERALS) TABS tablet Take 1 tablet by mouth daily. (May buy from over the counter): Vitamin supplement 03/17/18   Asuncion Layer I, NP  risperiDONE (RISPERDAL) 0.5 MG tablet Take 1 tablet (0.5 mg total) by mouth at bedtime. For mood control 03/16/18   Asuncion Layer I, NP  simvastatin (ZOCOR) 40 MG tablet Take 1 tablet (40 mg total) by mouth daily. For high cholesterol 03/16/18   Asuncion Layer I, NP      Allergies    Patient has no known allergies.    Review of Systems   Review of Systems  Physical Exam Updated Vital Signs BP (!) 157/75 (BP Location: Right Arm)   Pulse 65   Temp 98 F (36.7 C)   Resp 16   Wt 77.1 kg   SpO2 96%   BMI 34.34 kg/m  Physical Exam Vitals reviewed.  Constitutional:      General: She is not in acute distress.    Appearance: Normal appearance.  HENT:     Head: Normocephalic and atraumatic.     Right Ear: Tympanic membrane and external ear normal.     Left Ear: Tympanic membrane and external ear normal.     Nose: Nose normal.     Mouth/Throat:     Pharynx: Oropharynx is clear.  Eyes:     Extraocular Movements: Extraocular movements  intact.     Pupils: Pupils are equal, round, and reactive to light.     Comments: No nystagmus noted  Cardiovascular:     Rate and Rhythm: Normal rate and regular rhythm.     Pulses: Normal pulses.  Pulmonary:     Effort: Pulmonary effort is normal.     Breath sounds: Normal breath sounds.  Abdominal:     General: Abdomen is flat. Bowel sounds are normal.  Musculoskeletal:        General: Normal range of motion.     Cervical back: Normal range of motion.  Skin:    General: Skin is warm and dry.     Capillary Refill: Capillary refill takes less than 2 seconds.  Neurological:     General: No focal deficit present.     Mental Status: She is alert.     Cranial Nerves: No cranial nerve deficit.     Motor: No weakness.     Coordination: Coordination normal.      Gait: Gait normal.     Comments: Patient is ambulatory with normal gait Abdomen is are worsened with turning head to the right as she lays back but no nystagmus is noted  Psychiatric:        Mood and Affect: Mood normal.        Behavior: Behavior normal.     ED Results / Procedures / Treatments   Labs (all labs ordered are listed, but only abnormal results are displayed) Labs Reviewed  CBC - Abnormal; Notable for the following components:      Result Value   RBC 5.58 (*)    MCV 75.4 (*)    MCH 23.1 (*)    All other components within normal limits  CBG MONITORING, ED - Abnormal; Notable for the following components:   Glucose-Capillary 106 (*)    All other components within normal limits  BASIC METABOLIC PANEL WITH GFR    EKG None  Radiology CT Head Wo Contrast Result Date: 05/22/2023 CLINICAL DATA:  Neuro deficit, acute, stroke suspected.  Dizziness. EXAM: CT HEAD WITHOUT CONTRAST TECHNIQUE: Contiguous axial images were obtained from the base of the skull through the vertex without intravenous contrast. RADIATION DOSE REDUCTION: This exam was performed according to the departmental dose-optimization program which includes automated exposure control, adjustment of the mA and/or kV according to patient size and/or use of iterative reconstruction technique. COMPARISON:  CTA head 03/12/2018 FINDINGS: Brain: There is no evidence of an acute infarct, intracranial hemorrhage, mass, midline shift, or extra-axial fluid collection. Cerebral volume is normal. The ventricles are normal in size. Vascular: Calcified atherosclerosis at the skull base. No hyperdense vessel. Skull: No acute fracture or suspicious lesion. Sinuses/Orbits: Visualized paranasal sinuses and mastoid air cells are clear. Unremarkable orbits. Other: None. IMPRESSION: Negative head CT. Electronically Signed   By: Aundra Lee M.D.   On: 05/22/2023 14:30    Procedures Procedures    Medications Ordered in ED Medications  - No data to display  ED Course/ Medical Decision Making/ A&P Clinical Course as of 05/22/23 1526  Sat May 22, 2023  1418 CBC reviewed interpreted significant for normal hemoglobin, normal white blood cell count, normal platelets, decreased MCV, glucose normal [DR]  1514 Ct head reviewed and negative  [DR]    Clinical Course User Index [DR] Auston Blush, MD  Medical Decision Making Amount and/or Complexity of Data Reviewed Labs: ordered. Radiology: ordered.  68 year old female presents today with vertigo worsened with head turning to the right.  She has no other symptoms. Doubt hypotension or i decreased cerebral perfusion etiologies.  Patient with normal hemoglobin and blood pressures.Symptoms have been present for weeks although waxing and waning. No focal neurological deficits noted on my exam and no difficulty walking.  Doubt CVA No hyperglycemia or acute electrolyte abnormality. Discussed return precautions and need for follow-up and patient voices understanding.        Final Clinical Impression(s) / ED Diagnoses Final diagnoses:  Vertigo    Rx / DC Orders ED Discharge Orders          Ordered    meclizine (ANTIVERT) 12.5 MG tablet  3 times daily PRN        05/22/23 1525              Auston Blush, MD 05/22/23 1526

## 2023-05-24 DIAGNOSIS — D509 Iron deficiency anemia, unspecified: Secondary | ICD-10-CM | POA: Diagnosis not present

## 2023-05-24 DIAGNOSIS — I10 Essential (primary) hypertension: Secondary | ICD-10-CM | POA: Diagnosis not present

## 2023-05-24 DIAGNOSIS — E1169 Type 2 diabetes mellitus with other specified complication: Secondary | ICD-10-CM | POA: Diagnosis not present

## 2023-05-24 DIAGNOSIS — E7800 Pure hypercholesterolemia, unspecified: Secondary | ICD-10-CM | POA: Diagnosis not present

## 2023-05-24 DIAGNOSIS — R42 Dizziness and giddiness: Secondary | ICD-10-CM | POA: Diagnosis not present

## 2023-05-24 DIAGNOSIS — I493 Ventricular premature depolarization: Secondary | ICD-10-CM | POA: Diagnosis not present

## 2023-06-10 DIAGNOSIS — R42 Dizziness and giddiness: Secondary | ICD-10-CM | POA: Diagnosis not present

## 2023-06-10 DIAGNOSIS — R2689 Other abnormalities of gait and mobility: Secondary | ICD-10-CM | POA: Diagnosis not present

## 2023-06-24 DIAGNOSIS — R42 Dizziness and giddiness: Secondary | ICD-10-CM | POA: Diagnosis not present

## 2023-06-24 DIAGNOSIS — R2689 Other abnormalities of gait and mobility: Secondary | ICD-10-CM | POA: Diagnosis not present

## 2023-06-25 DIAGNOSIS — D509 Iron deficiency anemia, unspecified: Secondary | ICD-10-CM | POA: Diagnosis not present

## 2023-06-25 DIAGNOSIS — I10 Essential (primary) hypertension: Secondary | ICD-10-CM | POA: Diagnosis not present

## 2023-06-25 DIAGNOSIS — E119 Type 2 diabetes mellitus without complications: Secondary | ICD-10-CM | POA: Diagnosis not present

## 2023-06-25 DIAGNOSIS — E7800 Pure hypercholesterolemia, unspecified: Secondary | ICD-10-CM | POA: Diagnosis not present

## 2023-06-29 DIAGNOSIS — I493 Ventricular premature depolarization: Secondary | ICD-10-CM | POA: Diagnosis not present

## 2023-06-29 DIAGNOSIS — E7800 Pure hypercholesterolemia, unspecified: Secondary | ICD-10-CM | POA: Diagnosis not present

## 2023-06-29 DIAGNOSIS — E119 Type 2 diabetes mellitus without complications: Secondary | ICD-10-CM | POA: Diagnosis not present

## 2023-06-29 DIAGNOSIS — D509 Iron deficiency anemia, unspecified: Secondary | ICD-10-CM | POA: Diagnosis not present

## 2023-06-29 DIAGNOSIS — I10 Essential (primary) hypertension: Secondary | ICD-10-CM | POA: Diagnosis not present

## 2023-07-19 ENCOUNTER — Other Ambulatory Visit: Payer: Self-pay | Admitting: Family Medicine

## 2023-07-19 DIAGNOSIS — Z1231 Encounter for screening mammogram for malignant neoplasm of breast: Secondary | ICD-10-CM

## 2023-08-26 DIAGNOSIS — F321 Major depressive disorder, single episode, moderate: Secondary | ICD-10-CM | POA: Diagnosis not present

## 2023-09-07 ENCOUNTER — Ambulatory Visit
Admission: RE | Admit: 2023-09-07 | Discharge: 2023-09-07 | Disposition: A | Discharge status: Home / Self Care | Source: Ambulatory Visit | Attending: Family Medicine | Admitting: Family Medicine

## 2023-09-07 DIAGNOSIS — Z1231 Encounter for screening mammogram for malignant neoplasm of breast: Secondary | ICD-10-CM | POA: Diagnosis not present

## 2023-12-01 DIAGNOSIS — F321 Major depressive disorder, single episode, moderate: Secondary | ICD-10-CM | POA: Diagnosis not present

## 2023-12-23 DIAGNOSIS — E7800 Pure hypercholesterolemia, unspecified: Secondary | ICD-10-CM | POA: Diagnosis not present

## 2023-12-23 DIAGNOSIS — I10 Essential (primary) hypertension: Secondary | ICD-10-CM | POA: Diagnosis not present

## 2023-12-23 DIAGNOSIS — D509 Iron deficiency anemia, unspecified: Secondary | ICD-10-CM | POA: Diagnosis not present

## 2023-12-23 DIAGNOSIS — E119 Type 2 diabetes mellitus without complications: Secondary | ICD-10-CM | POA: Diagnosis not present

## 2023-12-27 DIAGNOSIS — E1169 Type 2 diabetes mellitus with other specified complication: Secondary | ICD-10-CM | POA: Diagnosis not present

## 2023-12-27 DIAGNOSIS — D509 Iron deficiency anemia, unspecified: Secondary | ICD-10-CM | POA: Diagnosis not present

## 2023-12-27 DIAGNOSIS — I10 Essential (primary) hypertension: Secondary | ICD-10-CM | POA: Diagnosis not present

## 2023-12-27 DIAGNOSIS — I493 Ventricular premature depolarization: Secondary | ICD-10-CM | POA: Diagnosis not present

## 2023-12-27 DIAGNOSIS — Z0000 Encounter for general adult medical examination without abnormal findings: Secondary | ICD-10-CM | POA: Diagnosis not present

## 2023-12-27 DIAGNOSIS — E119 Type 2 diabetes mellitus without complications: Secondary | ICD-10-CM | POA: Diagnosis not present

## 2023-12-27 DIAGNOSIS — E7800 Pure hypercholesterolemia, unspecified: Secondary | ICD-10-CM | POA: Diagnosis not present

## 2023-12-27 DIAGNOSIS — Z23 Encounter for immunization: Secondary | ICD-10-CM | POA: Diagnosis not present
